# Patient Record
Sex: Male | Born: 1935 | Race: Black or African American | Hispanic: No | Marital: Married | Smoking: Former smoker
Health system: Southern US, Community
[De-identification: ages and names within clinical notes are randomized; demographics above are authoritative.]

## PROBLEM LIST (undated history)

## (undated) DIAGNOSIS — J449 Chronic obstructive pulmonary disease, unspecified: Secondary | ICD-10-CM

## (undated) DIAGNOSIS — I10 Essential (primary) hypertension: Secondary | ICD-10-CM

## (undated) DIAGNOSIS — C61 Malignant neoplasm of prostate: Secondary | ICD-10-CM

## (undated) HISTORY — PX: HERNIA REPAIR: SHX51

## (undated) HISTORY — PX: OTHER SURGICAL HISTORY: SHX169

---

## 1998-04-14 ENCOUNTER — Encounter: Payer: Self-pay | Admitting: Internal Medicine

## 1998-04-14 ENCOUNTER — Emergency Department (HOSPITAL_COMMUNITY): Admission: EM | Admit: 1998-04-14 | Discharge: 1998-04-14 | Payer: Self-pay

## 2001-04-16 ENCOUNTER — Emergency Department (HOSPITAL_COMMUNITY): Admission: EM | Admit: 2001-04-16 | Discharge: 2001-04-16 | Payer: Self-pay | Admitting: Emergency Medicine

## 2003-06-05 ENCOUNTER — Encounter: Admission: RE | Admit: 2003-06-05 | Discharge: 2003-06-05 | Payer: Self-pay | Admitting: General Surgery

## 2003-06-08 ENCOUNTER — Ambulatory Visit (HOSPITAL_COMMUNITY): Admission: RE | Admit: 2003-06-08 | Discharge: 2003-06-08 | Payer: Self-pay | Admitting: General Surgery

## 2003-06-08 ENCOUNTER — Ambulatory Visit (HOSPITAL_BASED_OUTPATIENT_CLINIC_OR_DEPARTMENT_OTHER): Admission: RE | Admit: 2003-06-08 | Discharge: 2003-06-08 | Payer: Self-pay | Admitting: General Surgery

## 2004-08-03 ENCOUNTER — Ambulatory Visit (HOSPITAL_COMMUNITY): Admission: RE | Admit: 2004-08-03 | Discharge: 2004-08-03 | Payer: Self-pay | Admitting: Gastroenterology

## 2004-08-03 ENCOUNTER — Encounter (INDEPENDENT_AMBULATORY_CARE_PROVIDER_SITE_OTHER): Payer: Self-pay | Admitting: Specialist

## 2004-10-26 ENCOUNTER — Ambulatory Visit (HOSPITAL_COMMUNITY): Admission: RE | Admit: 2004-10-26 | Discharge: 2004-10-26 | Payer: Self-pay | Admitting: Urology

## 2004-10-29 ENCOUNTER — Ambulatory Visit (HOSPITAL_COMMUNITY): Admission: RE | Admit: 2004-10-29 | Discharge: 2004-10-29 | Payer: Self-pay | Admitting: Urology

## 2004-11-01 ENCOUNTER — Ambulatory Visit (HOSPITAL_COMMUNITY): Admission: RE | Admit: 2004-11-01 | Discharge: 2004-11-01 | Payer: Self-pay | Admitting: Urology

## 2004-12-02 ENCOUNTER — Encounter (HOSPITAL_COMMUNITY): Admission: RE | Admit: 2004-12-02 | Discharge: 2004-12-09 | Payer: Self-pay | Admitting: Urology

## 2004-12-28 ENCOUNTER — Ambulatory Visit: Admission: RE | Admit: 2004-12-28 | Discharge: 2005-01-06 | Payer: Self-pay | Admitting: Radiation Oncology

## 2005-03-14 ENCOUNTER — Ambulatory Visit: Admission: RE | Admit: 2005-03-14 | Discharge: 2005-03-30 | Payer: Self-pay | Admitting: Radiation Oncology

## 2005-04-06 ENCOUNTER — Ambulatory Visit: Admission: RE | Admit: 2005-04-06 | Discharge: 2005-07-05 | Payer: Self-pay | Admitting: Radiation Oncology

## 2005-05-30 ENCOUNTER — Encounter: Admission: RE | Admit: 2005-05-30 | Discharge: 2005-05-30 | Payer: Self-pay | Admitting: Urology

## 2005-06-05 ENCOUNTER — Ambulatory Visit (HOSPITAL_COMMUNITY): Admission: RE | Admit: 2005-06-05 | Discharge: 2005-06-05 | Payer: Self-pay | Admitting: Urology

## 2005-06-05 ENCOUNTER — Ambulatory Visit (HOSPITAL_BASED_OUTPATIENT_CLINIC_OR_DEPARTMENT_OTHER): Admission: RE | Admit: 2005-06-05 | Discharge: 2005-06-05 | Payer: Self-pay | Admitting: Urology

## 2005-07-13 ENCOUNTER — Ambulatory Visit: Admission: RE | Admit: 2005-07-13 | Discharge: 2005-09-13 | Payer: Self-pay | Admitting: Radiation Oncology

## 2007-01-09 ENCOUNTER — Ambulatory Visit (HOSPITAL_COMMUNITY): Admission: RE | Admit: 2007-01-09 | Discharge: 2007-01-09 | Payer: Self-pay | Admitting: Urology

## 2007-04-18 ENCOUNTER — Ambulatory Visit (HOSPITAL_COMMUNITY): Admission: RE | Admit: 2007-04-18 | Discharge: 2007-04-18 | Payer: Self-pay | Admitting: Urology

## 2007-07-12 ENCOUNTER — Ambulatory Visit (HOSPITAL_COMMUNITY): Admission: RE | Admit: 2007-07-12 | Discharge: 2007-07-12 | Payer: Self-pay | Admitting: Urology

## 2010-07-24 ENCOUNTER — Encounter: Payer: Self-pay | Admitting: Urology

## 2010-11-18 NOTE — Op Note (Signed)
NAME:  Larry Stanley, KRAMP NO.:  [id number]   MEDICAL RECORD NO.:  [id number]          PATIENT TYPE:  AMB   LOCATION:  ENDO                         FACILITY:  [facility]   PHYSICIAN:  Danise Edge, M.D.   DATE OF BIRTH:  1934-03-21   DATE OF PROCEDURE:  08/03/2004  DATE OF DISCHARGE:                                 OPERATIVE REPORT   PROCEDURES:  1.  Esophagogastroduodenoscopy and Savary esophageal dilation.  2.  Colonoscopy.   PROCEDURE INDICATION:  Larry Stanley is a 77 year old male born  03-18-1934. In 1988, Larry Stanley underwent surgery to treat an acute  peptic ulcer disease perforation. He is now experiencing solid food  dysphagia without heartburn for odynophagia.   Larry Stanley is due for his first screening colonoscopy and polypectomy to  prevent colon cancer.   CURRENT MEDICATIONS:  Lisinopril, Nexium, temazepam.   PAST MEDICAL HISTORY:  1.  Hypertension.  2.  Low back pain.  3.  Surgery for peptic ulcer perforation.  4.  Hyperlipidemia.  5.  Insomnia.  6.  Herniorrhaphy surgery.   FAMILY HISTORY:  Negative for colon cancer.   HABITS:  [name] smokes 20 cigarettes per day, but does not consume  alcohol.   ENDOSCOPIST:  Danise Edge, M.D.   PREMEDICATION:  Versed 7 mg, Demerol 50 mg.   PROCEDURE:  Esophagogastroduodenoscopy with Savary esophageal dilation.   DESCRIPTION OF PROCEDURE:  After obtaining informed consent, Larry Stanley  was placed in the left lateral decubitus position. I administered  intravenous Demerol and intravenous Versed to achieve conscious sedation for  the procedure. The patient's blood pressure, oxygen saturation and cardiac  rhythm were monitored throughout the procedure and documented in the medical  record. Larry Stanley ran a sinus tachycardia between 120 and 138 beats per  minute associated with a normal blood pressure and normal oxygen saturation.  I cannot really explain why he has a resting  sinus tachycardia even prior to  receiving conscious sedation for the procedure.   The Olympus gastroscope was passed through the posterior hypopharynx into  the proximal esophagus without difficulty. The hypopharynx, larynx and vocal  cords appeared normal.   ESOPHAGOSCOPY:  The proximal, mid and lower segments of the esophageal  mucosa appear normal. There does appear to be a shallow benign-appearing  peptic stricture at the esophagogastric junction. The Z-line is somewhat  indistinct.   GASTROSCOPY:  Retroflexed view of the gastric cardia and fundus was normal.  The gastric body, antrum and pylorus appear normal.   DUODENOSCOPY:  The duodenal bulb and descending duodenum appear normal.   SAVARY ESOPHAGEAL DILATION:  The Savary dilator wire was passed through the  gastroscope and the tip of the guidewire advanced to the distal gastric  antrum, as confirmed endoscopically. Fluoroscopy was not utilized. The 15-mm  Savary dilator passed without resistance.    Repeat esophagogastroscopy did not show any mucosal dilation of the shallow  peptic stricture at the esophagogastric junction and no gastric trauma due  to the guidewire   Biopsies were taken from the distal esophagus to rule out Barrett's  esophagus. I  was unable to clearly identify a Z-line.   ASSESSMENT:  Benign-appearing peptic stricture at the esophagogastric  junction. Fifteen-millimeter Savary dilator passed without mucosal dilation  of the stricture. Biopsies were taken from the distal esophagus to rule out  Barrett's esophagus.   RECOMMENDATIONS:  Continue proton pump inhibitor therapy. I suspect Mr.  Stanley's dysphagia is due to a motility disorder of the esophagus.   PROCEDURE:  Colonoscopy.   DESCRIPTION OF PROCEDURE:  Anal inspection and digital rectal exam were  unremarkable. Exam of the prostate does reveal a rock-hard enlarged  prostate. The Olympus adjustable pediatric colonoscope was introduced into   the rectum and advanced to the cecum. Colonic preparation for the exam today  was satisfactory.   Larry Stanley has universal colonic diverticulosis.   Rectum normal.   Sigmoid colon and descending colon: At 30 cm from the anal verge, two 1-mm  sessile polyps were removed with cold biopsy forceps.   Splenic flexure normal.   Transverse colon normal.   Hepatic flexure normal.   Ascending colon: In the proximal ascending colon, a 1-mm sessile polyp was  removed with cold biopsy forceps.   Cecum and ileocecal valve were normal.   ASSESSMENT:  1.  A small polyp was removed from the proximal ascending colon and 2 small      polyps were removed from the distal sigmoid colon. All polyps were      submitted in 1 bottle for pathological evaluation.  2.  Universal colonic diverticulosis.  3.  Rock-hard enlarged prostate.   RECOMMENDATIONS:  I am drawing a PSA in the [employer] along  with a CBC.  I am drawing the CBC mainly to assess for anemia that could  cause his resting sinus tachycardia.      MJ/MEDQ  D:  08/03/2004  T:  08/03/2004  Job:  [id number]   cc:   Fleet Contras, M.D.  [address]  [place]  [place] [address]  Fax: [phone]

## 2010-11-18 NOTE — Op Note (Signed)
NAME:  Larry Stanley, Larry Stanley             ACCOUNT NO.:  [id number]   MEDICAL RECORD NO.:  [id number]          PATIENT TYPE:  AMB   LOCATION:  [facility]                         [facility]ACILITY:  [facility]   PHYSICIAN:  Lindaann Slough, M.D.  DATE OF BIRTH:  1934/04/04   DATE OF PROCEDURE:  06/05/2005  DATE OF DISCHARGE:                                 OPERATIVE REPORT   PREOPERATIVE DIAGNOSES:  Adenocarcinoma of prostate.   POSTOPERATIVE DIAGNOSES:  Adenocarcinoma of prostate.   PROCEDURE:  I-125 seed implantation, cystoscopy.   SURGEON:  Danae Chen, M.D. and Maryln Gottron, M.D.   ANESTHESIA:  General.   INDICATIONS FOR PROCEDURE:  The patient is a 77 year old male who has biopsy  proven carcinoma of the prostate, Gleason score 7. PSA at the time of  diagnosis was 41.24. Metastatic workup showed no evidence of metastatic  disease. Treatment options were discussed with the patient and his wife and  they chose to have radiation. The plan is to treat with combination  radiation.  He had IMRT. He is now scheduled for seeds implantation.   Under general anesthesia, the patient was prepped and draped and placed in  the dorsal lithotomy position. Dosimetry planning was done by Dr. Dayton Scrape.  Then under ultrasound guidance, a total of 49 seeds through 18 needles were  implanted in the prostate. The total dose is 19.94 millicuries.   The Foley catheter that was previously placed in the bladder was then  removed. A flexible cystoscope was then inserted in the bladder. The  anterior urethra is normal. There is moderate prostatic hypertrophy. The  bladder is moderately trabeculated. There is no stone, no tumor, no seed in  the bladder. The cystoscope was retroflexed and there was no evidence of  seeds at the bladder neck. The ureteral orifices are in normal position and  shape with clear efflux. The cystoscope was then removed. A #16 Foley  catheter was then reinserted in the bladder.   The patient tolerated  the procedure well and left the OR in satisfactory  condition to post anesthesia care unit.      Lindaann Slough, M.D.  Electronically Signed     MN/MEDQ  D:  06/05/2005  T:  06/05/2005  Job:  [id number]   cc:   Maryln Gottron, M.D.  Fax: [phone]

## 2010-11-18 NOTE — Op Note (Signed)
NAME:  GREGG, WINCHELL NO.:  [id number]   MEDICAL RECORD NO.:  [id number]                   PATIENT TYPE:  AMB   LOCATION:  DSC                                  FACILITY:  [facility]   PHYSICIAN:  Jimmye Norman III, M.D.               DATE OF BIRTH:  September 30, 1933   DATE OF PROCEDURE:  06/08/2003  DATE OF DISCHARGE:                                 OPERATIVE REPORT   PREOPERATIVE DIAGNOSIS:  Right inguinal hernia.   POSTOPERATIVE DIAGNOSIS:  Direct right inguinal hernia.   PROCEDURE:  Right inguinal hernia repair with double layer mesh.   SURGEON:  Jimmye Norman, M.D.   ASSISTANT:  None.   ANESTHESIA:  General endotracheal.   ESTIMATED BLOOD LOSS:  Less than 10 mL.   COMPLICATIONS:  None.   CONDITION:  Stable.   FINDINGS:  The patient had a large medially-placed direct hernia with no  indirect component.   INDICATIONS FOR PROCEDURE:  The patient is a 77 year old with a large right  inguinal hernia who comes in now for a repair.   DESCRIPTION OF PROCEDURE:  The patient was taken to the operating room and  placed on the table in the supine position.  After an adequate endotracheal  anesthetic was administered, he was prepped and draped in the usual sterile  manner, exposing his right groin.  A transverse curvilinear incision 5.0 cm  long was made using a #10 blade at the level of the superficial inguinal  ring.  It was taken down to and through Scarpa's fascia, down to the  external oblique fascia which was then split along its fibers through the  superficial ring.  Directly underneath the opening in the fascia was the  ilioinguinal nerve which coursed down along with the spermatic cord.  We  mobilized the nerve so that it was behind the inferolateral flap of the  opened up external oblique fascia, and we mobilized the spermatic cord.  We  mobilized the cord with the Penrose drain and subsequently placed it up on a  work bench while we were subsequently  able to dissect out the hernia sac  from the anterior and lateral aspect of the cord.  It turns out that this  hernia was a direct hernia with a large direct defect up at the internal  ring.  We imbricated the hernia sac upon itself, closing it off from the  inguinal canal.  There was no indirect component.  Once we had done so, we  irrigated with antibiotic solution and then placed an oval piece of mesh  measuring probably 4.0 cm x 2.0 cm in size, attaching it to the pubic  tubercle and up to the superficial ring.  We tailored it so that it came out  at the internal ring.  Because of the size of the mesh which was attached,  we were able to fold it over and do a double  layer closure of the folded  mesh, attaching the second wing of it to the conjoined tendon, as the  initial wing was also.  Inferolaterally it was attached to the reflected  portion of the inguinal ligament on the apex of the fold of the mesh.  We  irrigated with antibiotic solution and then closed.  We took care not to  entrap the ilioinguinal nerve, which fell back into the inguinal canal with  the spermatic cord.  We closed the fascia using a running #3-0 Vicryl  suture.  We then closed Scarpa's fascia using three interrupted simple  stitches of #3-0 Vicryl.  Then the skin was closed using a running  subcuticular stitch of #4-0 Prolene.  We injected 0.25% Marcaine into the  wound and also at the anterior superior iliac spine, fanning out in three  areas in order to provide local control.  We used 20 mL of 0.5% Marcaine  without epinephrine.  Once this was done, and the skin was closed, we  applied a sterile dressing without problem.                                               Kathrin Ruddy, M.D.    JW/MEDQ  D:  06/08/2003  T:  06/09/2003  Job:  [id number]

## 2011-11-10 ENCOUNTER — Other Ambulatory Visit (HOSPITAL_COMMUNITY): Payer: Self-pay | Admitting: Urology

## 2011-11-10 DIAGNOSIS — Z8042 Family history of malignant neoplasm of prostate: Secondary | ICD-10-CM

## 2011-11-15 ENCOUNTER — Encounter (HOSPITAL_COMMUNITY)
Admission: RE | Admit: 2011-11-15 | Discharge: 2011-11-15 | Disposition: A | Discharge status: Home / Self Care | Payer: Medicare Other | Source: Ambulatory Visit | Attending: Urology | Admitting: Urology

## 2011-11-15 ENCOUNTER — Encounter (HOSPITAL_COMMUNITY): Payer: Self-pay

## 2011-11-15 ENCOUNTER — Ambulatory Visit (HOSPITAL_COMMUNITY)
Admission: RE | Admit: 2011-11-15 | Discharge: 2011-11-15 | Disposition: A | Discharge status: Home / Self Care | Payer: Medicare Other | Source: Ambulatory Visit | Attending: Urology | Admitting: Urology

## 2011-11-15 DIAGNOSIS — Z8042 Family history of malignant neoplasm of prostate: Secondary | ICD-10-CM | POA: Insufficient documentation

## 2011-11-15 HISTORY — DX: Malignant neoplasm of prostate: C61

## 2011-11-15 MED ORDER — TECHNETIUM TC 99M MEDRONATE IV KIT
24.5000 | PACK | Freq: Once | INTRAVENOUS | Status: AC | PRN
  Administered 2011-11-15: 24.5 via INTRAVENOUS

## 2012-09-11 ENCOUNTER — Ambulatory Visit (HOSPITAL_COMMUNITY)
Admission: RE | Admit: 2012-09-11 | Discharge: 2012-09-11 | Disposition: A | Discharge status: Home / Self Care | Payer: Medicare Other | Source: Ambulatory Visit | Attending: Internal Medicine | Admitting: Internal Medicine

## 2012-09-11 ENCOUNTER — Other Ambulatory Visit (HOSPITAL_COMMUNITY): Payer: Self-pay | Admitting: Internal Medicine

## 2012-09-11 DIAGNOSIS — J4489 Other specified chronic obstructive pulmonary disease: Secondary | ICD-10-CM | POA: Insufficient documentation

## 2012-09-11 DIAGNOSIS — J449 Chronic obstructive pulmonary disease, unspecified: Secondary | ICD-10-CM

## 2013-02-03 ENCOUNTER — Emergency Department (HOSPITAL_COMMUNITY)
Admission: EM | Admit: 2013-02-03 | Discharge: 2013-02-03 | Disposition: A | Discharge status: Home / Self Care | Payer: Medicare Other | Attending: Emergency Medicine | Admitting: Emergency Medicine

## 2013-02-03 ENCOUNTER — Encounter (HOSPITAL_COMMUNITY): Payer: Self-pay

## 2013-02-03 ENCOUNTER — Telehealth (HOSPITAL_COMMUNITY): Payer: Self-pay | Admitting: Emergency Medicine

## 2013-02-03 ENCOUNTER — Emergency Department (HOSPITAL_COMMUNITY): Payer: Medicare Other

## 2013-02-03 DIAGNOSIS — R05 Cough: Secondary | ICD-10-CM | POA: Insufficient documentation

## 2013-02-03 DIAGNOSIS — Z79899 Other long term (current) drug therapy: Secondary | ICD-10-CM | POA: Insufficient documentation

## 2013-02-03 DIAGNOSIS — IMO0002 Reserved for concepts with insufficient information to code with codable children: Secondary | ICD-10-CM | POA: Insufficient documentation

## 2013-02-03 DIAGNOSIS — J449 Chronic obstructive pulmonary disease, unspecified: Secondary | ICD-10-CM

## 2013-02-03 DIAGNOSIS — J441 Chronic obstructive pulmonary disease with (acute) exacerbation: Secondary | ICD-10-CM | POA: Insufficient documentation

## 2013-02-03 DIAGNOSIS — Z8546 Personal history of malignant neoplasm of prostate: Secondary | ICD-10-CM | POA: Insufficient documentation

## 2013-02-03 DIAGNOSIS — Z87891 Personal history of nicotine dependence: Secondary | ICD-10-CM | POA: Insufficient documentation

## 2013-02-03 DIAGNOSIS — R059 Cough, unspecified: Secondary | ICD-10-CM | POA: Insufficient documentation

## 2013-02-03 HISTORY — DX: Chronic obstructive pulmonary disease, unspecified: J44.9

## 2013-02-03 LAB — CBC
HCT: 35.3 % — ABNORMAL LOW (ref 39.0–52.0)
Hemoglobin: 11.9 g/dL — ABNORMAL LOW (ref 13.0–17.0)
MCH: 30.7 pg (ref 26.0–34.0)
MCV: 91 fL (ref 78.0–100.0)
Platelets: 261 10*3/uL (ref 150–400)
RDW: 12.5 % (ref 11.5–15.5)
WBC: 11.3 10*3/uL — ABNORMAL HIGH (ref 4.0–10.5)

## 2013-02-03 LAB — BASIC METABOLIC PANEL
CO2: 31 mEq/L (ref 19–32)
Chloride: 101 mEq/L (ref 96–112)
Potassium: 4.1 mEq/L (ref 3.5–5.1)

## 2013-02-03 MED ORDER — METHYLPREDNISOLONE SODIUM SUCC 125 MG IJ SOLR
125.0000 mg | Freq: Once | INTRAMUSCULAR | Status: AC
  Administered 2013-02-03: 125 mg via INTRAVENOUS
  Filled 2013-02-03: qty 2

## 2013-02-03 MED ORDER — ALBUTEROL SULFATE (5 MG/ML) 0.5% IN NEBU
5.0000 mg | INHALATION_SOLUTION | RESPIRATORY_TRACT | Status: DC | PRN
  Administered 2013-02-03: 5 mg via RESPIRATORY_TRACT
  Filled 2013-02-03: qty 0.5

## 2013-02-03 MED ORDER — PREDNISONE 50 MG PO TABS: 50.0000 mg | ORAL_TABLET | Freq: Every day | ORAL | Status: DC

## 2013-02-03 MED ORDER — ALBUTEROL (5 MG/ML) CONTINUOUS INHALATION SOLN
10.0000 mg/h | INHALATION_SOLUTION | RESPIRATORY_TRACT | Status: DC
  Administered 2013-02-03: 10 mg/h via RESPIRATORY_TRACT
  Filled 2013-02-03: qty 20

## 2013-02-03 MED ORDER — IPRATROPIUM BROMIDE 0.02 % IN SOLN
0.5000 mg | Freq: Once | RESPIRATORY_TRACT | Status: AC
  Administered 2013-02-03: 0.5 mg via RESPIRATORY_TRACT
  Filled 2013-02-03: qty 2.5

## 2013-02-03 NOTE — ED Notes (Signed)
ZOX:[id number]<AV> Expected date:<BR> Expected time:<BR> Means of arrival:<BR> Comments:<BR>

## 2013-02-03 NOTE — ED Provider Notes (Signed)
CSN: [id number]     Arrival date & time 02/03/13  0020 History     First MD Initiated Contact with Patient 02/03/13 0033     Chief Complaint  Patient presents with  . Shortness of Breath    HPI Comments: Pt has history of COPD with recurrent attacks.  This episode started this evening.  The treatments by EMS have helped.  Patient is a 79 y.o. male presenting with shortness of breath. The history is provided by the patient.  Shortness of Breath Severity:  Moderate Onset quality:  Gradual Duration:  3 hours Timing:  Constant Progression:  Worsening Chronicity:  Recurrent Context comment:  Pt quit smoking 1.5 year ago Relieved by:  Oxygen and inhaler Worsened by:  Activity Associated symptoms: cough   Associated symptoms: no chest pain, no fever, no sore throat, no vomiting and no wheezing   Risk factors: family hx of DVT   Risk factors: no hx of PE/DVT     Past Medical History  Diagnosis Date  . Prostate cancer   . COPD (chronic obstructive pulmonary disease)    Past Surgical History  Procedure Laterality Date  . Hernia repair    . Gastric ulcer repair     History reviewed. No pertinent family history. History  Substance Use Topics  . Smoking status: Former Games developer  . Smokeless tobacco: Not on file  . Alcohol Use: No    Review of Systems  Constitutional: Negative for fever.  HENT: Negative for sore throat.   Respiratory: Positive for cough and shortness of breath. Negative for wheezing.   Cardiovascular: Negative for chest pain.  Gastrointestinal: Negative for vomiting.  All other systems reviewed and are negative.    Allergies  Demerol and Promethazine  Home Medications   Current Outpatient Rx  Name  Route  Sig  Dispense  Refill  . albuterol (PROVENTIL) (2.5 MG/3ML) 0.083% nebulizer solution   Nebulization   Take 2.5 mg by nebulization every 6 (six) hours as needed for wheezing (SOB).         . Calcium Carbonate-Vitamin D (CALCIUM + D PO)   Oral  Take 1 tablet by mouth daily. Calcium 600 +D   800UI         . lisinopril-hydrochlorothiazide (PRINZIDE,ZESTORETIC) 20-12.5 MG per tablet   Oral   Take 1 tablet by mouth daily.         . tamsulosin (FLOMAX) 0.4 MG CAPS   Oral   Take 0.8 mg by mouth.         . predniSONE (DELTASONE) 50 MG tablet   Oral   Take 1 tablet (50 mg total) by mouth daily.   5 tablet   0     Dispense as written.    BP 144/75  Pulse 108  Temp(Src) 97.8 F (36.6 C) (Oral)  Resp 20  SpO2 96% Physical Exam  Nursing note and vitals reviewed. Constitutional: He appears well-developed and well-nourished. No distress.  HENT:  Head: Normocephalic and atraumatic.  Right Ear: External ear normal.  Left Ear: External ear normal.  Eyes: Conjunctivae are normal. Right eye exhibits no discharge. Left eye exhibits no discharge. No scleral icterus.  Neck: Neck supple. No tracheal deviation present.  Cardiovascular: Normal rate, regular rhythm and intact distal pulses.   Pulmonary/Chest: Effort normal. No accessory muscle usage or stridor. Not tachypneic. No respiratory distress. He has no decreased breath sounds. He has wheezes. He has no rales.  Abdominal: Soft. Bowel sounds are normal. He exhibits no  distension. There is no tenderness. There is no rebound and no guarding.  Musculoskeletal: He exhibits no edema and no tenderness.  Neurological: He is alert. He has normal strength. No sensory deficit. Cranial nerve deficit:  no gross defecits noted. He exhibits normal muscle tone. He displays no seizure activity. Coordination normal.  Skin: Skin is warm and dry. No rash noted.  Psychiatric: He has a normal mood and affect.    ED Course   Medications  albuterol (PROVENTIL) (5 MG/ML) 0.5% nebulizer solution 5 mg (5 mg Nebulization Given 02/03/13 0156)  albuterol (PROVENTIL,VENTOLIN) solution continuous neb (10 mg/hr Nebulization New Bag/Given 02/03/13 0212)  ipratropium (ATROVENT) nebulizer solution 0.5 mg (0.5  mg Nebulization Given 02/03/13 0156)  methylPREDNISolone sodium succinate (SOLU-MEDROL) 125 mg/2 mL injection 125 mg (125 mg Intravenous Given 02/03/13 0135)    Procedures (including critical care time)  Labs Reviewed  BASIC METABOLIC PANEL - Abnormal; Notable for the following:    Glucose, Bld 145 (*)    GFR calc non Af Amer 55 (*)    GFR calc Af Amer 64 (*)    All other components within normal limits  CBC - Abnormal; Notable for the following:    WBC 11.3 (*)    RBC 3.88 (*)    Hemoglobin 11.9 (*)    HCT 35.3 (*)    All other components within normal limits   Dg Chest 2 View  02/03/2013   *RADIOLOGY REPORT*  Clinical Data: New onset shortness of breath.  CHEST - 2 VIEW  Comparison: PA and lateral chest 09/11/2012 and 05/30/2005.  Findings: The lungs are clear.  Heart size is normal.  No pneumothorax or pleural effusion.  Surgical clips right upper quadrant of the abdomen noted.  IMPRESSION: No acute disease.   Original Report Authenticated By: Holley Dexter, M.D.   1. COPD (chronic obstructive pulmonary disease)     MDM  The patient improved with serial albuterol treatments as well as IV Solu-Medrol.  On repeat exam patient had very faint occasional wheeze. He was not having any retractions. His oxygen saturation was in the low to mid 90s range off oxygen.  The patient felt that his back at his baseline and felt comfortable going home at this time.  He'll be discharged home on a course of oral steroids. We'll continue his breathing treatments at home. I encouraged him to followup with his Dr. this week to be rechecked  Celene Kras, MD 02/03/13 [phone]

## 2013-02-03 NOTE — Discharge Instructions (Signed)

## 2013-02-03 NOTE — ED Notes (Signed)
Per EMS, pt has hx of COPD.  Has inhaler/nebulizer  at home.  Increased evening shortness of breath x 2 days.  Unable to catch breath tonight and called 911.  Tx in route.  Wheezing noted. Pt on nebulizer on EMS arrival and sats 99%.  Vitals:  bp 164/100, hr 110, resp 22, sats 99% post tx.  Hx Prostate ca,

## 2013-02-28 ENCOUNTER — Institutional Professional Consult (permissible substitution): Payer: Medicare Other | Admitting: Emergency Medicine

## 2013-08-28 ENCOUNTER — Inpatient Hospital Stay (HOSPITAL_COMMUNITY)
Admission: EM | Admit: 2013-08-28 | Discharge: 2013-08-30 | DRG: 191 | Disposition: A | Discharge status: Home / Self Care | Payer: Medicare HMO | Attending: Internal Medicine | Admitting: Internal Medicine

## 2013-08-28 ENCOUNTER — Encounter (HOSPITAL_COMMUNITY): Payer: Self-pay | Admitting: Emergency Medicine

## 2013-08-28 DIAGNOSIS — R339 Retention of urine, unspecified: Secondary | ICD-10-CM

## 2013-08-28 DIAGNOSIS — R140 Abdominal distension (gaseous): Secondary | ICD-10-CM

## 2013-08-28 DIAGNOSIS — J449 Chronic obstructive pulmonary disease, unspecified: Secondary | ICD-10-CM

## 2013-08-28 DIAGNOSIS — R143 Flatulence: Secondary | ICD-10-CM

## 2013-08-28 DIAGNOSIS — R300 Dysuria: Secondary | ICD-10-CM

## 2013-08-28 DIAGNOSIS — R197 Diarrhea, unspecified: Secondary | ICD-10-CM | POA: Diagnosis present

## 2013-08-28 DIAGNOSIS — N390 Urinary tract infection, site not specified: Secondary | ICD-10-CM

## 2013-08-28 DIAGNOSIS — Z87891 Personal history of nicotine dependence: Secondary | ICD-10-CM

## 2013-08-28 DIAGNOSIS — R0902 Hypoxemia: Secondary | ICD-10-CM

## 2013-08-28 DIAGNOSIS — E860 Dehydration: Secondary | ICD-10-CM

## 2013-08-28 DIAGNOSIS — Z79899 Other long term (current) drug therapy: Secondary | ICD-10-CM

## 2013-08-28 DIAGNOSIS — J441 Chronic obstructive pulmonary disease with (acute) exacerbation: Principal | ICD-10-CM | POA: Diagnosis present

## 2013-08-28 DIAGNOSIS — R142 Eructation: Secondary | ICD-10-CM | POA: Diagnosis present

## 2013-08-28 DIAGNOSIS — N179 Acute kidney failure, unspecified: Secondary | ICD-10-CM | POA: Diagnosis present

## 2013-08-28 DIAGNOSIS — Z885 Allergy status to narcotic agent status: Secondary | ICD-10-CM

## 2013-08-28 DIAGNOSIS — N35919 Unspecified urethral stricture, male, unspecified site: Secondary | ICD-10-CM

## 2013-08-28 DIAGNOSIS — R141 Gas pain: Secondary | ICD-10-CM | POA: Diagnosis present

## 2013-08-28 DIAGNOSIS — Z888 Allergy status to other drugs, medicaments and biological substances status: Secondary | ICD-10-CM

## 2013-08-28 DIAGNOSIS — C61 Malignant neoplasm of prostate: Secondary | ICD-10-CM | POA: Diagnosis present

## 2013-08-28 DIAGNOSIS — R000 Tachycardia, unspecified: Secondary | ICD-10-CM

## 2013-08-28 NOTE — ED Notes (Addendum)
Pt from home via [facility] c/o dysuria and difficulty urinating x 1 day. He had an episode of urinary incontinence en route. Hx of of prostate CA in which he had surgery. C/o lower abdominal pain that has been relieved with his incontinent episode. Pt's urologist is  Dr. Larwance Sachs. Pt also reports shortness of breath. Hx of COPD

## 2013-08-28 NOTE — ED Notes (Signed)
Bed: WA06 Expected date: 08/28/13 Expected time: 11:20 PM Means of arrival: Ambulance Comments: Painful urination

## 2013-08-28 NOTE — ED Notes (Signed)
Pt from home via [facility] c/o dysuria and urinary retention x 1 day.

## 2013-08-29 ENCOUNTER — Emergency Department (HOSPITAL_COMMUNITY): Payer: Medicare HMO

## 2013-08-29 ENCOUNTER — Inpatient Hospital Stay (HOSPITAL_COMMUNITY): Payer: Medicare HMO

## 2013-08-29 ENCOUNTER — Encounter (HOSPITAL_COMMUNITY): Payer: Self-pay

## 2013-08-29 DIAGNOSIS — R140 Abdominal distension (gaseous): Secondary | ICD-10-CM | POA: Diagnosis present

## 2013-08-29 DIAGNOSIS — J441 Chronic obstructive pulmonary disease with (acute) exacerbation: Secondary | ICD-10-CM | POA: Insufficient documentation

## 2013-08-29 DIAGNOSIS — N179 Acute kidney failure, unspecified: Secondary | ICD-10-CM | POA: Diagnosis present

## 2013-08-29 DIAGNOSIS — R142 Eructation: Secondary | ICD-10-CM

## 2013-08-29 DIAGNOSIS — R143 Flatulence: Secondary | ICD-10-CM

## 2013-08-29 DIAGNOSIS — N390 Urinary tract infection, site not specified: Secondary | ICD-10-CM | POA: Diagnosis present

## 2013-08-29 DIAGNOSIS — J449 Chronic obstructive pulmonary disease, unspecified: Secondary | ICD-10-CM | POA: Insufficient documentation

## 2013-08-29 DIAGNOSIS — C61 Malignant neoplasm of prostate: Secondary | ICD-10-CM | POA: Diagnosis present

## 2013-08-29 DIAGNOSIS — E860 Dehydration: Secondary | ICD-10-CM | POA: Diagnosis present

## 2013-08-29 DIAGNOSIS — I498 Other specified cardiac arrhythmias: Secondary | ICD-10-CM

## 2013-08-29 DIAGNOSIS — R141 Gas pain: Secondary | ICD-10-CM

## 2013-08-29 DIAGNOSIS — R339 Retention of urine, unspecified: Secondary | ICD-10-CM | POA: Diagnosis present

## 2013-08-29 DIAGNOSIS — R197 Diarrhea, unspecified: Secondary | ICD-10-CM | POA: Diagnosis present

## 2013-08-29 LAB — BASIC METABOLIC PANEL
BUN: 19 mg/dL (ref 6–23)
CO2: 28 mEq/L (ref 19–32)
Calcium: 9.8 mg/dL (ref 8.4–10.5)
Chloride: 91 mEq/L — ABNORMAL LOW (ref 96–112)
Creatinine, Ser: 1.63 mg/dL — ABNORMAL HIGH (ref 0.50–1.35)
GFR, EST AFRICAN AMERICAN: 45 mL/min — AB (ref >90)
GFR, EST NON AFRICAN AMERICAN: 39 mL/min — AB (ref >90)
Glucose, Bld: 191 mg/dL — ABNORMAL HIGH (ref 70–99)
POTASSIUM: 4.9 meq/L (ref 3.7–5.3)
SODIUM: 134 meq/L — AB (ref 137–147)

## 2013-08-29 LAB — HEPATIC FUNCTION PANEL
ALT: 16 U/L (ref 0–53)
AST: 19 U/L (ref 0–37)
Albumin: 3.8 g/dL (ref 3.5–5.2)
Alkaline Phosphatase: 56 U/L (ref 39–117)
Bilirubin, Direct: <0.2 mg/dL (ref 0.0–0.3)
TOTAL PROTEIN: 6.9 g/dL (ref 6.0–8.3)
Total Bilirubin: 0.4 mg/dL (ref 0.3–1.2)

## 2013-08-29 LAB — CBC
HCT: 39.6 % (ref 39.0–52.0)
Hemoglobin: 13.6 g/dL (ref 13.0–17.0)
MCH: 29.8 pg (ref 26.0–34.0)
MCHC: 34.3 g/dL (ref 30.0–36.0)
MCV: 86.7 fL (ref 78.0–100.0)
Platelets: 232 10*3/uL (ref 150–400)
RBC: 4.57 MIL/uL (ref 4.22–5.81)
RDW: 12.6 % (ref 11.5–15.5)
WBC: 19.7 10*3/uL — AB (ref 4.0–10.5)

## 2013-08-29 LAB — PRO B NATRIURETIC PEPTIDE: Pro B Natriuretic peptide (BNP): 18.5 pg/mL (ref 0–450)

## 2013-08-29 LAB — I-STAT TROPONIN, ED: TROPONIN I, POC: 0 ng/mL (ref 0.00–0.08)

## 2013-08-29 LAB — URINALYSIS, ROUTINE W REFLEX MICROSCOPIC
BILIRUBIN URINE: NEGATIVE
Glucose, UA: NEGATIVE mg/dL
Ketones, ur: NEGATIVE mg/dL
NITRITE: POSITIVE — AB
PROTEIN: NEGATIVE mg/dL
Specific Gravity, Urine: 1.015 (ref 1.005–1.030)
UROBILINOGEN UA: 1 mg/dL (ref 0.0–1.0)
pH: 5.5 (ref 5.0–8.0)

## 2013-08-29 LAB — URINE MICROSCOPIC-ADD ON

## 2013-08-29 LAB — CLOSTRIDIUM DIFFICILE BY PCR: Toxigenic C. Difficile by PCR: NEGATIVE

## 2013-08-29 LAB — I-STAT CG4 LACTIC ACID, ED: Lactic Acid, Venous: 0.91 mmol/L (ref 0.5–2.2)

## 2013-08-29 MED ORDER — METHYLPREDNISOLONE SODIUM SUCC 125 MG IJ SOLR
125.0000 mg | Freq: Once | INTRAMUSCULAR | Status: AC
  Administered 2013-08-29: 125 mg via INTRAVENOUS
  Filled 2013-08-29: qty 2

## 2013-08-29 MED ORDER — SODIUM CHLORIDE 0.9 % IV BOLUS (SEPSIS)
1000.0000 mL | Freq: Once | INTRAVENOUS | Status: AC
  Administered 2013-08-29: 1000 mL via INTRAVENOUS

## 2013-08-29 MED ORDER — LEVALBUTEROL HCL 1.25 MG/0.5ML IN NEBU
1.2500 mg | INHALATION_SOLUTION | Freq: Once | RESPIRATORY_TRACT | Status: AC
  Administered 2013-08-29: 1.25 mg via RESPIRATORY_TRACT
  Filled 2013-08-29: qty 3

## 2013-08-29 MED ORDER — IOHEXOL 350 MG/ML SOLN
75.0000 mL | Freq: Once | INTRAVENOUS | Status: AC | PRN
  Administered 2013-08-29: 75 mL via INTRAVENOUS

## 2013-08-29 MED ORDER — SODIUM CHLORIDE 0.9 % IV SOLN: 250.0000 mL | INTRAVENOUS | Status: DC | PRN

## 2013-08-29 MED ORDER — LEVALBUTEROL HCL 0.63 MG/3ML IN NEBU
0.6300 mg | INHALATION_SOLUTION | Freq: Four times a day (QID) | RESPIRATORY_TRACT | Status: DC | PRN
Start: 2013-08-29 — End: 2013-08-30

## 2013-08-29 MED ORDER — SODIUM CHLORIDE 0.9 % IJ SOLN: 3.0000 mL | Freq: Two times a day (BID) | INTRAMUSCULAR | Status: DC

## 2013-08-29 MED ORDER — SODIUM CHLORIDE 0.9 % IV BOLUS (SEPSIS): 1000.0000 mL | Freq: Once | INTRAVENOUS | Status: DC

## 2013-08-29 MED ORDER — LEVALBUTEROL HCL 0.63 MG/3ML IN NEBU
0.6300 mg | INHALATION_SOLUTION | Freq: Four times a day (QID) | RESPIRATORY_TRACT | Status: DC
  Administered 2013-08-29 – 2013-08-30 (×4): 0.63 mg via RESPIRATORY_TRACT
  Filled 2013-08-29 (×10): qty 3

## 2013-08-29 MED ORDER — SODIUM CHLORIDE 0.9 % IJ SOLN: 3.0000 mL | INTRAMUSCULAR | Status: DC | PRN

## 2013-08-29 MED ORDER — DEXTROSE 5 % IV SOLN
1.0000 g | Freq: Once | INTRAVENOUS | Status: AC
  Administered 2013-08-29: 1 g via INTRAVENOUS
  Filled 2013-08-29: qty 10

## 2013-08-29 MED ORDER — PREDNISONE 20 MG PO TABS
20.0000 mg | ORAL_TABLET | Freq: Two times a day (BID) | ORAL | Status: DC
  Administered 2013-08-29 – 2013-08-30 (×3): 20 mg via ORAL
  Filled 2013-08-29 (×5): qty 1

## 2013-08-29 MED ORDER — ONDANSETRON HCL 4 MG/2ML IJ SOLN: 4.0000 mg | Freq: Four times a day (QID) | INTRAMUSCULAR | Status: DC | PRN

## 2013-08-29 MED ORDER — IPRATROPIUM BROMIDE 0.02 % IN SOLN
0.5000 mg | Freq: Four times a day (QID) | RESPIRATORY_TRACT | Status: DC
  Administered 2013-08-29 – 2013-08-30 (×4): 0.5 mg via RESPIRATORY_TRACT
  Filled 2013-08-29 (×6): qty 2.5

## 2013-08-29 MED ORDER — LIDOCAINE HCL 2 % EX GEL
CUTANEOUS | Status: AC
  Administered 2013-08-29: 1
  Filled 2013-08-29: qty 10

## 2013-08-29 MED ORDER — DEXTROSE 5 % IV SOLN
1.0000 g | INTRAVENOUS | Status: DC
  Administered 2013-08-30: 1 g via INTRAVENOUS
  Filled 2013-08-29: qty 10

## 2013-08-29 MED ORDER — ONDANSETRON HCL 4 MG PO TABS: 4.0000 mg | ORAL_TABLET | Freq: Four times a day (QID) | ORAL | Status: DC | PRN

## 2013-08-29 MED ORDER — TAMSULOSIN HCL 0.4 MG PO CAPS
0.8000 mg | ORAL_CAPSULE | Freq: Every day | ORAL | Status: DC
Start: 2013-08-29 — End: 2013-08-30
  Administered 2013-08-29 – 2013-08-30 (×2): 0.8 mg via ORAL
  Filled 2013-08-29 (×2): qty 2

## 2013-08-29 NOTE — Progress Notes (Signed)
PROGRESS NOTE   Deadrian Toya GMW:[id number] DOB: 1934/01/26 DOA: 08/28/2013 PCP: No primary provider on file.  Brief narrative: Larry Stanley is an 80 y.o. male with a PMH of COPD and prostate cancer status post seed implantation who was admitted 08/29/13 with a 2 day history of diarrhea, wheezing/shortness of breath, urinary retention and dysuria.  Assessment/Plan: Principal Problem:   COPD with acute exacerbation Admitted and given 125 mg of Solumedrol followed by Prednisone 20 mg BID, nebulized bronchodilator therapy, and empiric Rocephin.  CT of the chest negative for PE and underlying pneumonia. Patient reports his breathing is much better today. Active Problems:   Prostate cancer Status post seed implantation.  Sees Dr. Janice Norrie.   Urinary retention / abdominal distention Nursing staff Unable to place Foley catheter. Has a history of stricture.  Nursing staff report that he has been urinating.  Resume Flomax. Renal ultrasound negative for hydronephrosis.   UTI (lower urinary tract infection) On empiric Rocephin. Followup urine cultures.   Dehydration / acute renal failure Continue IV fluids.   Diarrhea  Followup C. difficile PCR studies.   DVT Prophylaxis SCDs.  Code Status: Full. Family Communication: No family at the bedside. Disposition Plan: Home when stable.   IV access:  Peripheral IV.  Medical Consultants:  None.  Other Consultants:  None.  Anti-infectives:  Rocephin 08/29/13--->  HPI/Subjective: Larry Stanley feels much better today. He states his breathing has improved and that he has been able to empty his bladder. No further diarrhea.  Objective: Filed Vitals:   08/29/13 0426 08/29/13 0500 08/29/13 0540 08/29/13 0635  BP:  151/89 145/77 141/81  Pulse: 116 116 118 120  Temp:    97.7 F (36.5 C)  TempSrc:    Oral  Resp: 24 21 34 28  Height:    5\' 11"  (1.803 m)  Weight:    63.776 kg (140 lb 9.6 oz)  SpO2: 97% 98% 98% 95%     Intake/Output Summary (Last 24 hours) at 08/29/13 4403 Last data filed at 08/29/13 0357  Gross per 24 hour  Intake    125 ml  Output    450 ml  Net   -325 ml    Exam: Gen:  NAD Cardiovascular:  RRR, No M/R/G Respiratory:  Lungs with a few high pitched wheezes Gastrointestinal:  Abdomen soft, NT/ND, + BS Extremities:  No C/E/C  Data Reviewed: Basic Metabolic Panel:  Recent Labs Lab 08/29/13  NA 134*  K 4.9  CL 91*  CO2 28  GLUCOSE 191*  BUN 19  CREATININE 1.63*  CALCIUM 9.8   GFR Estimated Creatinine Clearance: 34.2 ml/min (by C-G formula based on Cr of 1.63). Liver Function Tests:  Recent Labs Lab 08/29/13 0534  AST 19  ALT 16  ALKPHOS 56  BILITOT 0.4  PROT 6.9  ALBUMIN 3.8   CBC:  Recent Labs Lab 08/29/13  WBC 19.7*  HGB 13.6  HCT 39.6  MCV 86.7  PLT 232   BNP (last 3 results)  Recent Labs  08/29/13 0001  PROBNP 18.5   Microbiology No results found for this or any previous visit (from the past 240 hour(s)).   Procedures and Diagnostic Studies: Ct Angio Chest Pe W/cm &/or Wo Cm  08/29/2013   CLINICAL DATA:  Shortness of breath and leukocytosis.  EXAM: CT ANGIOGRAPHY CHEST WITH CONTRAST  TECHNIQUE: Multidetector CT imaging of the chest was performed using the standard protocol during bolus administration of intravenous contrast. Multiplanar CT image reconstructions and MIPs were  obtained to evaluate the vascular anatomy.  CONTRAST:  75 mL of Omnipaque 350 IV contrast  COMPARISON:  Chest radiograph performed earlier today at 12:47 a.m.  FINDINGS: There is no evidence of pulmonary embolus.  Minimal emphysematous change is noted, predominantly in the upper lung lobes. The lungs are otherwise clear. There is no evidence of significant focal consolidation, pleural effusion or pneumothorax. No masses are identified; no abnormal focal contrast enhancement is seen.  The mediastinum is unremarkable in appearance, aside from calcification along the aortic  arch and scattered coronary artery calcifications. A tiny hiatal hernia is seen. No pericardial effusion is identified. No mediastinal lymphadenopathy is seen the great vessels are grossly unremarkable in appearance. No axillary lymphadenopathy is seen. The visualized portions of the thyroid gland are unremarkable in appearance.  A 1.0 cm hypodensity within the left hepatic lobe is nonspecific but may reflect a small cyst. The visualized portions of the spleen and adrenal glands are grossly unremarkable.  No acute osseous abnormalities are seen.  Review of the MIP images confirms the above findings.  IMPRESSION: 1. No evidence of pulmonary embolus. 2. Minimal emphysematous change noted, predominantly at the upper lung lobes. Lungs otherwise clear. 3. Tiny hiatal hernia seen. 4. Nonspecific 1.0 cm hypodensity within the left hepatic lobe may reflect a small cyst.   Electronically Signed   By: Garald Balding M.D.   On: 08/29/2013 03:50   Dg Chest Port 1 View  08/29/2013   CLINICAL DATA:  Shortness of breath  EXAM: PORTABLE CHEST - 1 VIEW  COMPARISON:  02/03/2013 and prior chest radiographs  FINDINGS: The cardiomediastinal silhouette is unremarkable.  COPD changes noted.  Mild pulmonary vascular congestion is noted.  There is no evidence of focal airspace disease, pulmonary edema, suspicious pulmonary nodule/mass, pleural effusion, or pneumothorax. No acute bony abnormalities are identified.  IMPRESSION: Mild pulmonary vascular congestion.  COPD.   Electronically Signed   By: Hassan Rowan M.D.   On: 08/29/2013 00:54    Scheduled Meds: . [START ON 08/30/2013] cefTRIAXone (ROCEPHIN)  IV  1 g Intravenous Q24H  . ipratropium  0.5 mg Nebulization Q6H  . levalbuterol  0.63 mg Nebulization Q6H  . predniSONE  20 mg Oral BID WC  . sodium chloride  3 mL Intravenous Q12H  . sodium chloride  3 mL Intravenous Q12H   Continuous Infusions:   Time spent: 30 minutes.   LOS: 1 day   RAMA,CHRISTINA  [facility] Pager [phone]. If unable to reach me by pager, please call my cell phone at [phone].  *Please note that the hospitalists switch teams on Wednesdays. Please call the flow manager at [phone] if you are having difficulty reaching the hospitalist taking care of this patient as she can update you and provide the most up-to-date pager number of provider caring for the patient. If 8PM-8AM, please contact night-coverage at [email or web address], password [facility]  08/29/2013, 8:12 AM    **Disclaimer: This note was dictated with voice recognition software. Similar sounding words can inadvertently be transcribed and this note may contain transcription errors which may not have been corrected upon publication of note.**

## 2013-08-29 NOTE — ED Provider Notes (Signed)
CSN: [id number]     Arrival date & time 08/28/13  2337 History   First MD Initiated Contact with Patient 08/29/13 0001     Chief Complaint  Patient presents with  . Urinary Retention  . Dysuria  . Shortness of Breath     (Consider location/radiation/quality/duration/timing/severity/associated sxs/prior Treatment) HPI 80 year old male presents to emergency room from home via EMS with complaint of urinary retention, lower abdominal distention, and pain.  Patient noted to be wheezing, tachypneic, and hypoxic upon presentation as well.  Patient is a poor historian.  Per his wife, patient has not urinated throughout the day, she noticed that he was acting different this evening, and did not want dinner.  They called his urologist, when it was discovered he had not urinated through the day, and they recommended a warm bath.  When this did not relieve symptoms, they called 911.  Patient reports he had urinary incontinence in route, which relieved his symptoms.  Patient reports history of COPD, normally receives albuterol nebulized treatments.  He does not normally wear oxygen.  He denies any fever or cough.  He is not currently on steroids to no leg swelling, no recent illnesses.  He denies history of tachycardia.  No prior history of urinary retention.  He does have a remote history of prostate cancer treated by Dr. Janice Norrie  Past Medical History  Diagnosis Date  . Prostate cancer   . COPD (chronic obstructive pulmonary disease)    Past Surgical History  Procedure Laterality Date  . Hernia repair    . Gastric ulcer repair     History reviewed. No pertinent family history. History  Substance Use Topics  . Smoking status: Former Research scientist (life sciences)  . Smokeless tobacco: Not on file  . Alcohol Use: No    Review of Systems  See History of Present Illness; otherwise all other systems are reviewed and negative   Allergies  Demerol and Promethazine  Home Medications   Current Outpatient Rx  Name  Route   Sig  Dispense  Refill  . albuterol (PROVENTIL HFA;VENTOLIN HFA) 108 (90 BASE) MCG/ACT inhaler   Inhalation   Inhale into the lungs every 6 (six) hours as needed for wheezing or shortness of breath.         Marland Kitchen albuterol (PROVENTIL) (2.5 MG/3ML) 0.083% nebulizer solution   Nebulization   Take 2.5 mg by nebulization every 6 (six) hours as needed for wheezing (SOB).         . Calcium Carbonate-Vitamin D (CALCIUM + D PO)   Oral   Take 1 tablet by mouth daily. Calcium 600 +D   800UI         . esomeprazole (NEXIUM) 40 MG capsule   Oral   Take 40 mg by mouth daily at 12 noon.         Marland Kitchen lisinopril-hydrochlorothiazide (PRINZIDE,ZESTORETIC) 20-12.5 MG per tablet   Oral   Take 1 tablet by mouth daily.         . tamsulosin (FLOMAX) 0.4 MG CAPS   Oral   Take 0.8 mg by mouth.          BP 141/81  Temp(Src) 98.3 F (36.8 C) (Oral)  Resp 36  Ht 5\' 11"  (1.803 m)  Wt 144 lb (65.318 kg)  BMI 20.09 kg/m2  SpO2 100% Physical Exam  Nursing note and vitals reviewed. Constitutional: He is oriented to person, place, and time. He appears well-developed and well-nourished. He appears distressed.  HENT:  Head: Normocephalic and atraumatic.  Nose: Nose normal.  Mouth/Throat: Oropharynx is clear and moist.  Cardiovascular: Regular rhythm, normal heart sounds and intact distal pulses.  Exam reveals no gallop and no friction rub.   No murmur heard. Tachycardia  Pulmonary/Chest: He is in respiratory distress.  Patient with tachypnea, and end expiratory wheezing  Abdominal: He exhibits distension (patient's abdomen appears distended, he reports this is his norm.). He exhibits no mass. There is no tenderness. There is no rebound.  Decreased bowel sounds  Genitourinary: No penile tenderness.  Patient is uncircumcised.  Foreskin is easily reduced.  Patient appears to have a stricture at the distal end of the urethra.  Unable to pass Foley catheter, straight catheter, despite use of viscous  lidocaine.  With this.  His lidocaine, passes easily.  Bladder scan done, 200 cc of urine in the bladder, patient does not report any lower abdominal pressure or pain, since having urination with EMS  Musculoskeletal: He exhibits no edema and no tenderness.  Neurological: He is alert and oriented to person, place, and time. He exhibits normal muscle tone. Coordination normal.  Skin: Skin is warm and dry. No rash noted. No erythema. No pallor.    ED Course  Procedures (including critical care time) Labs Review Labs Reviewed  BASIC METABOLIC PANEL - Abnormal; Notable for the following:    Sodium 134 (*)    Chloride 91 (*)    Glucose, Bld 191 (*)    Creatinine, Ser 1.63 (*)    GFR calc non Af Amer 39 (*)    GFR calc Af Amer 45 (*)    All other components within normal limits  CBC - Abnormal; Notable for the following:    WBC 19.7 (*)    All other components within normal limits  URINALYSIS, ROUTINE W REFLEX MICROSCOPIC - Abnormal; Notable for the following:    Color, Urine ORANGE (*)    APPearance CLOUDY (*)    Hgb urine dipstick LARGE (*)    Nitrite POSITIVE (*)    Leukocytes, UA SMALL (*)    All other components within normal limits  URINE CULTURE  URINE MICROSCOPIC-ADD ON  PRO B NATRIURETIC PEPTIDE  I-STAT TROPOININ, ED   Imaging Review Ct Angio Chest Pe W/cm &/or Wo Cm  08/29/2013   CLINICAL DATA:  Shortness of breath and leukocytosis.  EXAM: CT ANGIOGRAPHY CHEST WITH CONTRAST  TECHNIQUE: Multidetector CT imaging of the chest was performed using the standard protocol during bolus administration of intravenous contrast. Multiplanar CT image reconstructions and MIPs were obtained to evaluate the vascular anatomy.  CONTRAST:  75 mL of Omnipaque 350 IV contrast  COMPARISON:  Chest radiograph performed earlier today at 12:47 a.m.  FINDINGS: There is no evidence of pulmonary embolus.  Minimal emphysematous change is noted, predominantly in the upper lung lobes. The lungs are otherwise  clear. There is no evidence of significant focal consolidation, pleural effusion or pneumothorax. No masses are identified; no abnormal focal contrast enhancement is seen.  The mediastinum is unremarkable in appearance, aside from calcification along the aortic arch and scattered coronary artery calcifications. A tiny hiatal hernia is seen. No pericardial effusion is identified. No mediastinal lymphadenopathy is seen the great vessels are grossly unremarkable in appearance. No axillary lymphadenopathy is seen. The visualized portions of the thyroid gland are unremarkable in appearance.  A 1.0 cm hypodensity within the left hepatic lobe is nonspecific but may reflect a small cyst. The visualized portions of the spleen and adrenal glands are grossly unremarkable.  No acute  osseous abnormalities are seen.  Review of the MIP images confirms the above findings.  IMPRESSION: 1. No evidence of pulmonary embolus. 2. Minimal emphysematous change noted, predominantly at the upper lung lobes. Lungs otherwise clear. 3. Tiny hiatal hernia seen. 4. Nonspecific 1.0 cm hypodensity within the left hepatic lobe may reflect a small cyst.   Electronically Signed   By: Garald Balding M.D.   On: 08/29/2013 03:50   Dg Chest Port 1 View  08/29/2013   CLINICAL DATA:  Shortness of breath  EXAM: PORTABLE CHEST - 1 VIEW  COMPARISON:  02/03/2013 and prior chest radiographs  FINDINGS: The cardiomediastinal silhouette is unremarkable.  COPD changes noted.  Mild pulmonary vascular congestion is noted.  There is no evidence of focal airspace disease, pulmonary edema, suspicious pulmonary nodule/mass, pleural effusion, or pneumothorax. No acute bony abnormalities are identified.  IMPRESSION: Mild pulmonary vascular congestion.  COPD.   Electronically Signed   By: Hassan Rowan M.D.   On: 08/29/2013 00:54    EKG Interpretation    Date/Time:  Friday August 29 2013 01:23:05 EST Ventricular Rate:  135 PR Interval:  148 QRS Duration: 75 QT  Interval:  277 QTC Calculation: 415 R Axis:   77 Text Interpretation:  Sinus tachycardia Since last tracing rate faster Confirmed by Tifini Reeder  MD, Yuki Brunsman ([id number]) on 08/29/2013 3:51:50 AM            MDM   Final diagnoses:  COPD exacerbation  Urethral stricture  Dysuria  Hypoxia  Sinus tachycardia    80 year old male with tachycardia, tachypnea, new oxygen requirement.  Lab showed leukocytosis, and urine with too numerous to count red blood cells, some wbc, nitrite.  He should appears to have a urethral stricture, no prior history of same.  Chest x-ray without pneumonia, but mild pulmonary vascular congestion with COPD.  Wheezing has resolved with Xopenex, the patient is persistently tachycardic in the 130s sinus rhythm.  Will check BNP, plan for CT angio chest for possible PE as source of symptoms.  Expect patient will need admission given his persistent tachycardia.  May need to discuss also with urology as patient has been unable to pass urine, as well as he normally does, and appears to have a urethral stricture.  4:30 AM Pt's HR has improved with fluids slightly, but he still is hypoxic and returns to HR in 130s with ambulation.  No PE noted on CTA.  Will need admit to hospital for probable COPD exacerbation.  Pt has been able to urinate some here, but may still need inpatient consult to urology for urethral dilation.  Kalman Drape, MD 08/29/13 [phone]

## 2013-08-29 NOTE — Care Management Note (Signed)
    Page 1 of 1   08/29/2013     2:19:37 PM   CARE MANAGEMENT NOTE 08/29/2013  Patient:  Larry Stanley, Larry Stanley   Account Number:  [id number]  Date Initiated:  08/29/2013  Documentation initiated by:  Dessa Phi  Subjective/Objective Assessment:   80 Y/O M ADMITTED W/INABILITY TO URINATE.COPD EXAC.TK:[id number] CA.     Action/Plan:   FROM HOME.HAS PCP,PHARMACY.   Anticipated DC Date:  09/01/2013   Anticipated DC Plan:  [place]  CM consult      Choice offered to / List presented to:             Status of service:  In process, will continue to follow Medicare Important Message given?   (If response is "NO", the following Medicare IM given date fields will be blank) Date Medicare IM given:   Date Additional Medicare IM given:    Discharge Disposition:    Per UR Regulation:  Reviewed for med. necessity/level of care/duration of stay  If discussed at Long Length of Stay Meetings, dates discussed:    Comments:  08/29/13 Idan Prime RN,BSN NCM [phone] NO ANTICIPATED D/C NEEDS.

## 2013-08-29 NOTE — ED Notes (Signed)
Pt and wife would like all medical history or information be only discussed with them and no other family members.

## 2013-08-29 NOTE — ED Notes (Signed)
Pt transported to CT ?

## 2013-08-29 NOTE — ED Notes (Signed)
Pt ambulated in hall with assistance of cane. Pt oxygen saturation on room air was 93% with a heart rate of 118. At the end of ambulation patient oxygen saturation was 88% on room air with a heart rate of 141.

## 2013-08-29 NOTE — ED Notes (Signed)
Repeat EKG given to MD [facility].

## 2013-08-29 NOTE — ED Notes (Signed)
Pt returned from CT °

## 2013-08-29 NOTE — ED Notes (Signed)
Hospitalist at bedside 

## 2013-08-29 NOTE — ED Notes (Signed)
Bed: WA22 Expected date:  Expected time:  Means of arrival:  Comments: 

## 2013-08-29 NOTE — ED Notes (Signed)
Unsuccessful attempts at foley catheter. 69 F and 57F used. Straight Catheter also used. Pt has a stricture and catheter will not enter urinary meatus. MD Sharol Given Aware.

## 2013-08-29 NOTE — H&P (Signed)
Chief Complaint:  Cant pee  HPI: 80 yo male who has h/o prostate cancer 8 years ago s/p seed implantation, copd comes in with over 24 hours of not being able to urinate and dysuria when any little dribble comes out.  Denies fevers.  They called his urologist who advised for him to take a hot bath, but that did not help.  He did not drink anything all day long as he was scared b/c he was not urinating.  No n/v.  Has also been having diarrhea for 2 days nonbloody.  Also with worsening abd distention and sob.  Pt says he is bloated b/c of gas.  No n/v.  No abd pain.  Was wheezing initially on arrival to ED, which has resolved with xoponex.  Pt is still sob, which is new for him.  He feels some better since arrival to ED.  Review of Systems:  Positive and negative as per HPI otherwise all other systems are negative  Past Medical History: Past Medical History  Diagnosis Date  . Prostate cancer   . COPD (chronic obstructive pulmonary disease)    Past Surgical History  Procedure Laterality Date  . Hernia repair    . Gastric ulcer repair      Medications: Prior to Admission medications   Medication Sig Start Date End Date Taking? Authorizing Provider  albuterol (PROVENTIL HFA;VENTOLIN HFA) 108 (90 BASE) MCG/ACT inhaler Inhale into the lungs every 6 (six) hours as needed for wheezing or shortness of breath.   Yes Historical Provider, MD  albuterol (PROVENTIL) (2.5 MG/3ML) 0.083% nebulizer solution Take 2.5 mg by nebulization every 6 (six) hours as needed for wheezing (SOB).   Yes Historical Provider, MD  Calcium Carbonate-Vitamin D (CALCIUM + D PO) Take 1 tablet by mouth daily. Calcium 600 +D   800UI   Yes Historical Provider, MD  esomeprazole (NEXIUM) 40 MG capsule Take 40 mg by mouth daily at 12 noon.   Yes Historical Provider, MD  lisinopril-hydrochlorothiazide (PRINZIDE,ZESTORETIC) 20-12.5 MG per tablet Take 1 tablet by mouth daily.   Yes Historical Provider, MD  tamsulosin (FLOMAX) 0.4  MG CAPS Take 0.8 mg by mouth.   Yes Historical Provider, MD    Allergies:   Allergies  Allergen Reactions  . Demerol [Meperidine]   . Promethazine Swelling    Social History:  reports that he has quit smoking. He does not have any smokeless tobacco history on file. He reports that he does not drink alcohol or use illicit drugs.  Family History: History reviewed. No pertinent family history.  Physical Exam: Filed Vitals:   08/29/13 0330 08/29/13 0400 08/29/13 0426 08/29/13 0500  BP: 127/84 133/81  151/89  Pulse: 113 113 116 116  Temp:      TempSrc:      Resp: 22 30 24 21   Height:      Weight:      SpO2: 97% 97% 97% 98%   General appearance: alert, cooperative and mild distress Head: Normocephalic, without obvious abnormality, atraumatic Eyes: negative Nose: Nares normal. Septum midline. Mucosa normal. No drainage or sinus tenderness. Neck: no JVD and supple, symmetrical, trachea midline Lungs: clear to auscultation bilaterally Heart: regular rate and rhythm, S1, S2 normal, no murmur, click, rub or gallop Abdomen: normal findings: bowel sounds normal and soft, non-tender and abnormal findings:  distended no r/g nonacute Extremities: extremities normal, atraumatic, no cyanosis or edema Pulses: 2+ and symmetric Skin: Skin color, texture, turgor normal. No rashes or lesions Neurologic: Grossly normal  Labs on Admission:   Recent Labs  08/29/13  NA 134*  K 4.9  CL 91*  CO2 28  GLUCOSE 191*  BUN 19  CREATININE 1.63*  CALCIUM 9.8    Recent Labs  08/29/13  WBC 19.7*  HGB 13.6  HCT 39.6  MCV 86.7  PLT 232   Radiological Exams on Admission: Ct Angio Chest Pe W/cm &/or Wo Cm  08/29/2013   CLINICAL DATA:  Shortness of breath and leukocytosis.  EXAM: CT ANGIOGRAPHY CHEST WITH CONTRAST  TECHNIQUE: Multidetector CT imaging of the chest was performed using the standard protocol during bolus administration of intravenous contrast. Multiplanar CT image  reconstructions and MIPs were obtained to evaluate the vascular anatomy.  CONTRAST:  75 mL of Omnipaque 350 IV contrast  COMPARISON:  Chest radiograph performed earlier today at 12:47 a.m.  FINDINGS: There is no evidence of pulmonary embolus.  Minimal emphysematous change is noted, predominantly in the upper lung lobes. The lungs are otherwise clear. There is no evidence of significant focal consolidation, pleural effusion or pneumothorax. No masses are identified; no abnormal focal contrast enhancement is seen.  The mediastinum is unremarkable in appearance, aside from calcification along the aortic arch and scattered coronary artery calcifications. A tiny hiatal hernia is seen. No pericardial effusion is identified. No mediastinal lymphadenopathy is seen the great vessels are grossly unremarkable in appearance. No axillary lymphadenopathy is seen. The visualized portions of the thyroid gland are unremarkable in appearance.  A 1.0 cm hypodensity within the left hepatic lobe is nonspecific but may reflect a small cyst. The visualized portions of the spleen and adrenal glands are grossly unremarkable.  No acute osseous abnormalities are seen.  Review of the MIP images confirms the above findings.  IMPRESSION: 1. No evidence of pulmonary embolus. 2. Minimal emphysematous change noted, predominantly at the upper lung lobes. Lungs otherwise clear. 3. Tiny hiatal hernia seen. 4. Nonspecific 1.0 cm hypodensity within the left hepatic lobe may reflect a small cyst.   Electronically Signed   By: Garald Balding M.D.   On: 08/29/2013 03:50   Dg Chest Port 1 View  08/29/2013   CLINICAL DATA:  Shortness of breath  EXAM: PORTABLE CHEST - 1 VIEW  COMPARISON:  02/03/2013 and prior chest radiographs  FINDINGS: The cardiomediastinal silhouette is unremarkable.  COPD changes noted.  Mild pulmonary vascular congestion is noted.  There is no evidence of focal airspace disease, pulmonary edema, suspicious pulmonary nodule/mass,  pleural effusion, or pneumothorax. No acute bony abnormalities are identified.  IMPRESSION: Mild pulmonary vascular congestion.  COPD.   Electronically Signed   By: Hassan Rowan M.D.   On: 08/29/2013 00:54    Assessment/Plan  80 yo male with urinary retention from probable uretral stricture, uti, mild renal failure, dehydration, abd distention along with mild copde  Principal Problem:   COPD with acute exacerbation  Place on some prednisone, freq nebs.  Think his sob may be more related to his abd distention.  See below.  Active Problems:   COPD (chronic obstructive pulmonary disease)   Prostate cancer   Urinary retention   UTI (lower urinary tract infection)  Rocephin iv   Dehydration     Acute renal failure     Abdominal distention-  Will obtain lfts and abd u/s.  Also ck cdiff and lactic acid level.  rescan bladder.   Diarrhea  Ck cdiff.  Will need to call urology in am, as we could not place foley due to stricture in the ED,  will hold off on any further fluids at this time until foley placed and bladder rescanned.  Karsen Fellows A 08/29/2013, 5:35 AM

## 2013-08-30 DIAGNOSIS — R339 Retention of urine, unspecified: Secondary | ICD-10-CM

## 2013-08-30 DIAGNOSIS — R197 Diarrhea, unspecified: Secondary | ICD-10-CM

## 2013-08-30 DIAGNOSIS — N179 Acute kidney failure, unspecified: Secondary | ICD-10-CM

## 2013-08-30 DIAGNOSIS — N35919 Unspecified urethral stricture, male, unspecified site: Secondary | ICD-10-CM

## 2013-08-30 LAB — URINE CULTURE
CULTURE: NO GROWTH
Colony Count: NO GROWTH

## 2013-08-30 LAB — BASIC METABOLIC PANEL
BUN: 28 mg/dL — ABNORMAL HIGH (ref 6–23)
CHLORIDE: 101 meq/L (ref 96–112)
CO2: 27 meq/L (ref 19–32)
Calcium: 9.3 mg/dL (ref 8.4–10.5)
Creatinine, Ser: 1.47 mg/dL — ABNORMAL HIGH (ref 0.50–1.35)
GFR calc non Af Amer: 44 mL/min — ABNORMAL LOW (ref >90)
GFR, EST AFRICAN AMERICAN: 51 mL/min — AB (ref >90)
Glucose, Bld: 171 mg/dL — ABNORMAL HIGH (ref 70–99)
POTASSIUM: 3.9 meq/L (ref 3.7–5.3)
Sodium: 142 mEq/L (ref 137–147)

## 2013-08-30 LAB — CBC
HEMATOCRIT: 35.5 % — AB (ref 39.0–52.0)
Hemoglobin: 11.7 g/dL — ABNORMAL LOW (ref 13.0–17.0)
MCH: 29.1 pg (ref 26.0–34.0)
MCHC: 33 g/dL (ref 30.0–36.0)
MCV: 88.3 fL (ref 78.0–100.0)
PLATELETS: 253 10*3/uL (ref 150–400)
RBC: 4.02 MIL/uL — AB (ref 4.22–5.81)
RDW: 13.1 % (ref 11.5–15.5)
WBC: 18.1 10*3/uL — AB (ref 4.0–10.5)

## 2013-08-30 MED ORDER — LEVOFLOXACIN 500 MG PO TABS: 500.0000 mg | ORAL_TABLET | Freq: Every day | ORAL | Status: DC

## 2013-08-30 MED ORDER — PREDNISONE 10 MG PO TABS: 5.0000 mg | ORAL_TABLET | Freq: Every day | ORAL | Status: DC

## 2013-08-30 MED ORDER — PREDNISONE 10 MG PO TABS
ORAL_TABLET | ORAL | Status: DC
Start: 2013-08-30 — End: 2016-10-17

## 2013-08-30 NOTE — Discharge Instructions (Signed)
Chronic Obstructive Pulmonary Disease Exacerbation  Chronic obstructive pulmonary disease (COPD) is a common lung problem. In COPD, the flow of air from the lungs is limited. COPD exacerbations are times that breathing gets worse and you need extra treatment. Without treatment they can be life threatening. If they happen often, your lungs can become more damaged. HOME CARE  Do not smoke.  Avoid tobacco smoke and other things that bother your lungs.  If given, take your antibiotic medicine as told. Finish the medicine even if you start to feel better.  Only take medicines as told by your doctor.  Drink enough fluids to keep your pee (urine) clear or pale yellow (unless your doctor has told you not to).  Use a cool mist machine (vaporizer).  If you use oxygen or a machine that turns liquid medicine into a mist (nebulizer), continue to use them as told.  Keep up with shots (vaccinations) as told by your doctor.  Exercise regularly.  Eat healthy foods.  Keep all doctor visits as told. GET HELP RIGHT AWAY IF:  You are very short of breath and it gets worse.  You have trouble talking.  You have bad chest pain.  You have blood in your spit (sputum).  You have a fever.  You keep throwing up (vomiting).  You feel weak, or you pass out (faint).  You feel confused.  You keep getting worse. MAKE SURE YOU:   Understand these instructions.  Will watch your condition.  Will get help right away if you are not doing well or get worse. Document Released: 06/08/2011 Document Revised: 04/09/2013 Document Reviewed: 02/21/2013 [facility] Patient Information 2014 [place].  Acute Urinary Retention, Male Acute urinary retention is the temporary inability to urinate. This is a common problem in older men. As men age their prostates become larger and block the flow of urine from the bladder. This is usually a problem that has come on gradually.  HOME CARE INSTRUCTIONS If you are  sent home with a Foley catheter and a drainage system, you will need to discuss the best course of action with your health care provider. While the catheter is in, maintain a good intake of fluids. Keep the drainage bag emptied and lower than your catheter. This is so that contaminated urine will not flow back into your bladder, which could lead to a urinary tract infection. There are two main types of drainage bags. One is a large bag that usually is used at night. It has a good capacity that will allow you to sleep through the night without having to empty it. The second type is called a leg bag. It has a smaller capacity, so it needs to be emptied more frequently. However, the main advantage is that it can be attached by a leg strap and can go underneath your clothing, allowing you the freedom to move about or leave your home. Only take over-the-counter or prescription medicines for pain, discomfort, or fever as directed by your health care provider.  SEEK MEDICAL CARE IF:  You develop a low-grade fever.  You experience spasms or leakage of urine with the spasms. SEEK IMMEDIATE MEDICAL CARE IF:   You develop chills or fever.  Your catheter stops draining urine.  Your catheter falls out.  You start to develop increased bleeding that does not respond to rest and increased fluid intake. MAKE SURE YOU:  Understand these instructions.  Will watch your condition.  Will get help right away if you are not doing  well or get worse. Document Released: 09/25/2000 Document Revised: 02/19/2013 Document Reviewed: 11/28/2012 [facility] Patient Information 2014 [place], [place].

## 2013-08-30 NOTE — Discharge Summary (Signed)
Physician Discharge Summary  Navon Obie [id number] DOB: [birth year] DOA: 08/28/2013  PCP: Philis Fendt, MD  Admit date: 08/28/2013 Discharge date: 08/30/2013  Recommendations for Outpatient Follow-up:  1. F/U with Dr. Lorra Hals for a BMET in 1 week, resume anti-hypertensive if renal function back to baseline. 2. F/U with Dr. Janice Norrie in 1 week to evaluate urinary retention.  Discharge Diagnoses:  Principal Problem:    COPD with acute exacerbation Active Problems:    Prostate cancer    Urinary retention without evidence of UTI (lower urinary tract infection)    Dehydration    Acute renal failure    Abdominal distention    Diarrhea  Discharge Condition: Improved.  Diet recommendation: Low sodium, heart healthy.  History of present illness:  Larry Stanley is an 80 y.o. male with a PMH of COPD and prostate cancer status post seed implantation who was admitted 08/29/13 with a 2 day history of diarrhea, wheezing/shortness of breath, urinary retention and dysuria.  Hospital Course by problem:  Principal Problem:  COPD with acute exacerbation  Admitted and given 125 mg of Solumedrol followed by Prednisone 20 mg BID, nebulized bronchodilator therapy, and empiric Rocephin. CT of the chest negative for PE and underlying pneumonia. Patient reported significant improvement in his breathing 24 hours after admission.  D/C home on prednisone taper and 5 days of Levaquin.  Active Problems:  Prostate cancer  Status post seed implantation. Sees Dr. Janice Norrie. F/U with him post discharge. Urinary retention / abdominal distention without evidence of UTI  Nursing staff was unable to place Foley catheter. Has a history of stricture. The patient was able to void on his own.  A post void residual was checked prior to D/C, and was 50 cc.  . Continue Flomax. Renal ultrasound negative for hydronephrosis. Treated with empiric Rocephin for possible UTI but urine cultures negative.  No need for further  antibiotics.  Dehydration / acute renal failure  Renal function improved at D/C.  Hold Lisinopril/HCTZ until renal function back to baseline.  Diarrhea  C. difficile PCR studies negative.  Discharge Exam: Filed Vitals:   08/30/13 0600  BP: 132/82  Pulse: 99  Temp: 97.8 F (36.6 C)  Resp: 22   Filed Vitals:   08/29/13 1925 08/29/13 2128 08/30/13 0600 08/30/13 1000  BP:  113/66 132/82   Pulse:  95 99   Temp:  97.8 F (36.6 C) 97.8 F (36.6 C)   TempSrc:  Oral Oral   Resp:  26 22   Height:      Weight:   63.277 kg (139 lb 8 oz)   SpO2: 97% 96% 98% 94%    Gen:  NAD Cardiovascular:  RRR, No M/R/G Respiratory: Lungs CTAB Gastrointestinal: Abdomen soft, NT/ND with normal active bowel sounds. Extremities: No C/E/C   Discharge Instructions      Discharge Orders   Future Orders Complete By Expires   Call MD for:  severe uncontrolled pain  As directed    Call MD for:  temperature >100.4  As directed    Call MD for:  As directed    Scheduling Instructions:     Worsening shortness of breath, increased mucous production, inability to urinate.   Diet - low sodium heart healthy  As directed    Discharge instructions  As directed    Comments:     Stop your blood pressure medicine until you follow up with Dr. Lorra Hals and let him check your kidney function with a blood test.    You  were cared for by Dr. Jacquelynn Cree  (a hospitalist) during your hospital stay. If you have any questions about your discharge medications or the care you received while you were in the hospital after you are discharged, you can call the unit and ask to speak with the hospitalist on call if the hospitalist that took care of you is not available. Once you are discharged, your primary care physician will handle any further medical issues. Please note that NO REFILLS for any discharge medications will be authorized once you are discharged, as it is imperative that you return to your primary care physician (or  establish a relationship with a primary care physician if you do not have one) for your aftercare needs so that they can reassess your need for medications and monitor your lab values.  Any outstanding tests can be reviewed by your PCP at your follow up visit.  It is also important to review any medicine changes with your PCP.  Please bring these d/c instructions with you to your next visit so your physician can review these changes with you.  If you do not have a primary care physician, you can call [phone] for a physician referral.  It is highly recommended that you obtain a PCP for hospital follow up.   Increase activity slowly  As directed        Medication List    STOP taking these medications       lisinopril-hydrochlorothiazide 20-12.5 MG per tablet  Commonly known as:  PRINZIDE,ZESTORETIC      TAKE these medications       albuterol 108 (90 BASE) MCG/ACT inhaler  Commonly known as:  PROVENTIL HFA;VENTOLIN HFA  Inhale into the lungs every 6 (six) hours as needed for wheezing or shortness of breath.     albuterol (2.5 MG/3ML) 0.083% nebulizer solution  Commonly known as:  PROVENTIL  Take 2.5 mg by nebulization every 6 (six) hours as needed for wheezing (SOB).     budesonide-formoterol 160-4.5 MCG/ACT inhaler  Commonly known as:  SYMBICORT  Inhale 2 puffs into the lungs 2 (two) times daily.     CALCIUM + D PO  Take 1 tablet by mouth daily. Calcium 600 +D   800UI     esomeprazole 40 MG capsule  Commonly known as:  NEXIUM  Take 40 mg by mouth daily at 12 noon.     levofloxacin 500 MG tablet  Commonly known as:  LEVAQUIN  Take 1 tablet (500 mg total) by mouth daily.     predniSONE 10 MG tablet  Commonly known as:  DELTASONE  Take 3 tablets daily for 2 days, then 2 tablets daily for 2 days, then 1 tablet daily for 2 days then stop.     tamsulosin 0.4 MG Caps capsule  Commonly known as:  FLOMAX  Take 0.8 mg by mouth.       Follow-up Information   Follow up with  AVBUERE,EDWIN A, MD In 1 week. (For blood work and to determine if it is safe for you to resume your blood pressure medication.)    Specialty:  Internal Medicine   Contact information:   [place] [address] [phone]       Follow up with Nesi, Kirtland Bouchard, MD. Schedule an appointment as soon as possible for a visit in 1 week. (To follow up on your urinary retention.)    Specialty:  Urology   Contact information:   [address] [place] [address]  [address] [place] [address] [phone]        The results of significant diagnostics from this hospitalization (including imaging, microbiology, ancillary and laboratory) are listed below for reference.    Significant Diagnostic Studies: Ct Angio Chest Pe W/cm &/or Wo Cm  08/29/2013   CLINICAL DATA:  Shortness of breath and leukocytosis.  EXAM: CT ANGIOGRAPHY CHEST WITH CONTRAST  TECHNIQUE: Multidetector CT imaging of the chest was performed using the standard protocol during bolus administration of intravenous contrast. Multiplanar CT image reconstructions and MIPs were obtained to evaluate the vascular anatomy.  CONTRAST:  75 mL of Omnipaque 350 IV contrast  COMPARISON:  Chest radiograph performed earlier today at 12:47 a.m.  FINDINGS: There is no evidence of pulmonary embolus.  Minimal emphysematous change is noted, predominantly in the upper lung lobes. The lungs are otherwise clear. There is no evidence of significant focal consolidation, pleural effusion or pneumothorax. No masses are identified; no abnormal focal contrast enhancement is seen.  The mediastinum is unremarkable in appearance, aside from calcification along the aortic arch and scattered coronary artery calcifications. A tiny hiatal hernia is seen. No pericardial effusion is identified. No mediastinal lymphadenopathy is seen the great vessels are grossly unremarkable in appearance. No axillary lymphadenopathy is seen. The visualized  portions of the thyroid gland are unremarkable in appearance.  A 1.0 cm hypodensity within the left hepatic lobe is nonspecific but may reflect a small cyst. The visualized portions of the spleen and adrenal glands are grossly unremarkable.  No acute osseous abnormalities are seen.  Review of the MIP images confirms the above findings.  IMPRESSION: 1. No evidence of pulmonary embolus. 2. Minimal emphysematous change noted, predominantly at the upper lung lobes. Lungs otherwise clear. 3. Tiny hiatal hernia seen. 4. Nonspecific 1.0 cm hypodensity within the left hepatic lobe may reflect a small cyst.   Electronically Signed   By: Roanna Raider M.D.   On: 08/29/2013 03:50   US Renal  08/29/2013   CLINICAL DATA:  Urinary retention.  Assess for hydronephrosis.  EXAM: RENAL/URINARY TRACT ULTRASOUND COMPLETE  COMPARISON:  None.  FINDINGS: Right Kidney:  Length: 10.3 cm. Echogenicity within normal limits. No hydronephrosis visualized. There are simple cysts within the right kidney largest in the upper to mid pole measures 1.1 x 1.3 x 1 cm.  Left Kidney:  Length: 9.9 cm. Echogenicity within normal limits. No hydronephrosis visualized. There are simple cysts in the left kidney, largest in the lower pole measures 1.8 x 1.8 x 1.9 cm.  Bladder:  Appears normal for degree of bladder distention.  IMPRESSION: No hydronephrosis bilaterally.  Simple cysts in both kidneys.   Electronically Signed   By: Sherian Rein M.D.   On: 08/29/2013 09:41   Dg Chest Port 1 View  08/29/2013   CLINICAL DATA:  Shortness of breath  EXAM: PORTABLE CHEST - 1 VIEW  COMPARISON:  02/03/2013 and prior chest radiographs  FINDINGS: The cardiomediastinal silhouette is unremarkable.  COPD changes noted.  Mild pulmonary vascular congestion is noted.  There is no evidence of focal airspace disease, pulmonary edema, suspicious pulmonary nodule/mass, pleural effusion, or pneumothorax. No acute bony abnormalities are identified.  IMPRESSION: Mild pulmonary  vascular congestion.  COPD.   Electronically Signed   By: Laveda Abbe M.D.   On: 08/29/2013 00:54    Labs:  Basic Metabolic Panel:  Recent Labs Lab 08/29/13 08/30/13 0514  NA 134* 142  K 4.9 3.9  CL 91* 101  CO2 28 27  GLUCOSE 191*  171*  BUN 19 28*  CREATININE 1.63* 1.47*  CALCIUM 9.8 9.3   GFR Estimated Creatinine Clearance: 37.7 ml/min (by C-G formula based on Cr of 1.47). Liver Function Tests:  Recent Labs Lab 08/29/13 0534  AST 19  ALT 16  ALKPHOS 56  BILITOT 0.4  PROT 6.9  ALBUMIN 3.8    CBC:  Recent Labs Lab 08/29/13 08/30/13 0514  WBC 19.7* 18.1*  HGB 13.6 11.7*  HCT 39.6 35.5*  MCV 86.7 88.3  PLT 232 253   Microbiology Recent Results (from the past 240 hour(s))  URINE CULTURE     Status: None   Collection Time    08/29/13 12:10 AM      Result Value Ref Range Status   Specimen Description URINE, CLEAN CATCH   Final   Special Requests NONE   Final   Culture  Setup Time     Final   Value: 08/29/2013 11:28     Performed at [facility]     Final   Value: NO GROWTH     Performed at [facility]   Culture     Final   Value: NO GROWTH     Performed at [facility]   Report Status 08/30/2013 FINAL   Final  CLOSTRIDIUM DIFFICILE BY PCR     Status: None   Collection Time    08/29/13  8:52 AM      Result Value Ref Range Status   C difficile by pcr NEGATIVE  NEGATIVE Final   Comment: Performed at [facility]    Time coordinating discharge: 35 minutes.  Signed:  Azharia Surratt  Pager [phone] [facility] 08/30/2013, 11:48 AM

## 2014-05-26 ENCOUNTER — Other Ambulatory Visit (HOSPITAL_COMMUNITY): Payer: Self-pay | Admitting: Urology

## 2014-05-26 DIAGNOSIS — C61 Malignant neoplasm of prostate: Secondary | ICD-10-CM

## 2014-06-04 ENCOUNTER — Encounter (HOSPITAL_COMMUNITY)
Admission: RE | Admit: 2014-06-04 | Discharge: 2014-06-04 | Disposition: A | Discharge status: Home / Self Care | Payer: Medicare HMO | Source: Ambulatory Visit | Attending: Urology | Admitting: Urology

## 2014-06-04 DIAGNOSIS — C61 Malignant neoplasm of prostate: Secondary | ICD-10-CM | POA: Diagnosis present

## 2014-06-04 MED ORDER — TECHNETIUM TC 99M MEDRONATE IV KIT
25.7000 | PACK | Freq: Once | INTRAVENOUS | Status: AC | PRN
  Administered 2014-06-04: 25.7 via INTRAVENOUS

## 2016-08-01 ENCOUNTER — Other Ambulatory Visit: Payer: Self-pay | Admitting: Urology

## 2016-08-01 DIAGNOSIS — Z8546 Personal history of malignant neoplasm of prostate: Secondary | ICD-10-CM

## 2016-08-07 ENCOUNTER — Other Ambulatory Visit: Payer: Self-pay | Admitting: General Surgery

## 2016-08-08 ENCOUNTER — Ambulatory Visit (HOSPITAL_COMMUNITY)
Admission: RE | Admit: 2016-08-08 | Discharge: 2016-08-08 | Disposition: A | Discharge status: Home / Self Care | Payer: Medicare HMO | Source: Ambulatory Visit | Attending: Urology | Admitting: Urology

## 2016-08-08 ENCOUNTER — Encounter (HOSPITAL_COMMUNITY): Payer: Self-pay

## 2016-08-08 DIAGNOSIS — Z87891 Personal history of nicotine dependence: Secondary | ICD-10-CM | POA: Diagnosis not present

## 2016-08-08 DIAGNOSIS — I10 Essential (primary) hypertension: Secondary | ICD-10-CM | POA: Insufficient documentation

## 2016-08-08 DIAGNOSIS — J449 Chronic obstructive pulmonary disease, unspecified: Secondary | ICD-10-CM | POA: Insufficient documentation

## 2016-08-08 DIAGNOSIS — Z8546 Personal history of malignant neoplasm of prostate: Secondary | ICD-10-CM | POA: Insufficient documentation

## 2016-08-08 DIAGNOSIS — R590 Localized enlarged lymph nodes: Secondary | ICD-10-CM | POA: Insufficient documentation

## 2016-08-08 DIAGNOSIS — C774 Secondary and unspecified malignant neoplasm of inguinal and lower limb lymph nodes: Secondary | ICD-10-CM | POA: Insufficient documentation

## 2016-08-08 HISTORY — DX: Essential (primary) hypertension: I10

## 2016-08-08 LAB — CBC
HCT: 39.4 % (ref 39.0–52.0)
Hemoglobin: 13.2 g/dL (ref 13.0–17.0)
MCH: 29.8 pg (ref 26.0–34.0)
MCHC: 33.5 g/dL (ref 30.0–36.0)
MCV: 88.9 fL (ref 78.0–100.0)
PLATELETS: 263 10*3/uL (ref 150–400)
RBC: 4.43 MIL/uL (ref 4.22–5.81)
RDW: 13.6 % (ref 11.5–15.5)
WBC: 9.4 10*3/uL (ref 4.0–10.5)

## 2016-08-08 LAB — PROTIME-INR
INR: 1.07
PROTHROMBIN TIME: 13.9 s (ref 11.4–15.2)

## 2016-08-08 LAB — APTT: aPTT: 39 seconds — ABNORMAL HIGH (ref 24–36)

## 2016-08-08 MED ORDER — FLUMAZENIL 0.5 MG/5ML IV SOLN
INTRAVENOUS | Status: AC
  Filled 2016-08-08: qty 5

## 2016-08-08 MED ORDER — MIDAZOLAM HCL 2 MG/2ML IJ SOLN
INTRAMUSCULAR | Status: AC
  Filled 2016-08-08: qty 4

## 2016-08-08 MED ORDER — SODIUM CHLORIDE 0.9 % IV SOLN
INTRAVENOUS | Status: DC
  Administered 2016-08-08: 12:00:00 via INTRAVENOUS

## 2016-08-08 MED ORDER — FENTANYL CITRATE (PF) 100 MCG/2ML IJ SOLN
INTRAMUSCULAR | Status: AC
  Filled 2016-08-08: qty 4

## 2016-08-08 MED ORDER — MIDAZOLAM HCL 2 MG/2ML IJ SOLN
INTRAMUSCULAR | Status: AC | PRN
  Administered 2016-08-08 (×2): 1 mg via INTRAVENOUS

## 2016-08-08 MED ORDER — NALOXONE HCL 0.4 MG/ML IJ SOLN
INTRAMUSCULAR | Status: AC
  Filled 2016-08-08: qty 1

## 2016-08-08 MED ORDER — FENTANYL CITRATE (PF) 100 MCG/2ML IJ SOLN
INTRAMUSCULAR | Status: AC | PRN
  Administered 2016-08-08 (×2): 50 ug via INTRAVENOUS

## 2016-08-08 NOTE — H&P (Signed)
Chief Complaint: Patient was seen in consultation today for left inguinal lymph node biopsy at the request of Herrick,Larry Stanley  Referring Physician(s): Herrick,Larry Stanley  Supervising Physician: Arne Cleveland  Patient Status: Larry Stanley  History of Present Illness: Larry Stanley is a 83 y.o. male with hx of prostate cancer. He now has pelvic and inguinal adenopathy on imaging concerning for malignant process. He is referred for US guided biopsy PMHx, meds, labs, imaging reviewed. Has been NPO all morning. Wife is at bedside  Past Medical History:  Diagnosis Date  . COPD (chronic obstructive pulmonary disease) ([place])   . Hypertension   . Prostate cancer [facility])     Past Surgical History:  Procedure Laterality Date  . gastric ulcer repair    . HERNIA REPAIR      Allergies: Demerol [meperidine] and Promethazine  Medications: Prior to Admission medications   Medication Sig Start Date End Date Taking? Authorizing Provider  albuterol (PROVENTIL HFA;VENTOLIN HFA) 108 (90 BASE) MCG/ACT inhaler Inhale into the lungs every 6 (six) hours as needed for wheezing or shortness of breath.   Yes Historical Provider, MD  albuterol (PROVENTIL) (2.5 MG/3ML) 0.083% nebulizer solution Take 2.5 mg by nebulization every 6 (six) hours as needed for wheezing (SOB).   Yes Historical Provider, MD  budesonide-formoterol (SYMBICORT) 160-4.5 MCG/ACT inhaler Inhale 2 puffs into the lungs 2 (two) times daily.   Yes Historical Provider, MD  lisinopril (PRINIVIL,ZESTRIL) 10 MG tablet Take 10 mg by mouth daily.   Yes Historical Provider, MD  ranitidine (ZANTAC) 75 MG tablet Take 75 mg by mouth 2 (two) times daily.   Yes Historical Provider, MD  tamsulosin (FLOMAX) 0.4 MG CAPS Take 0.8 mg by mouth.   Yes Historical Provider, MD  Calcium Carbonate-Vitamin D (CALCIUM + D PO) Take 1 tablet by mouth daily. Calcium 600 +D   800UI    Historical Provider, MD  esomeprazole (NEXIUM) 40 MG capsule Take 40 mg  by mouth daily at 12 noon.    Historical Provider, MD  levofloxacin (LEVAQUIN) 500 MG tablet Take 1 tablet (500 mg total) by mouth daily. 08/30/13   Venetia Maxon Rama, MD  predniSONE (DELTASONE) 10 MG tablet Take 3 tablets daily for 2 days, then 2 tablets daily for 2 days, then 1 tablet daily for 2 days then stop. 08/30/13   Venetia Maxon Rama, MD     History reviewed. No pertinent family history.  Social History   Social History  . Marital status: Married    Spouse name: N/A  . Number of children: N/A  . Years of education: N/A   Social History Main Topics  . Smoking status: Former Smoker    Types: Cigarettes    Quit date: 08/09/2011  . Smokeless tobacco: Never Used  . Alcohol use No  . Drug use: No  . Sexual activity: Not Asked   Other Topics Concern  . None   Social History Narrative  . None    Review of Systems: A 12 point ROS discussed and pertinent positives are indicated in the HPI above.  All other systems are negative.  Review of Systems  Vital Signs: BP (!) 142/89   Pulse 100   Temp 97.7 F (36.5 C) (Oral)   Resp 16   SpO2 100%   Physical Exam  Constitutional: He is oriented to person, place, and time. He appears well-developed and well-nourished. No distress.  HENT:  Head: Normocephalic.  Mouth/Throat: Oropharynx is clear and moist.  Neck: Normal  range of motion. No JVD present. No tracheal deviation present.  Cardiovascular: Normal rate, regular rhythm and normal heart sounds.   Pulmonary/Chest: Effort normal and breath sounds normal. No respiratory distress.  Abdominal: Soft. He exhibits no distension. There is no tenderness.  Musculoskeletal:  Firm, NT left inguinal adenopathy palpable  Neurological: He is alert and oriented to person, place, and time.  Skin: Skin is warm and dry.  Psychiatric: He has a normal mood and affect. Judgment normal.    Mallampati Score:  MD Evaluation Airway: WNL Heart: WNL Abdomen: WNL Chest/ Lungs: WNL ASA   Classification: 3 Mallampati/Airway Score: One  Imaging: No results found.  Labs:  CBC: No results for input(s): WBC, HGB, HCT, PLT in the last 8760 hours.  COAGS: No results for input(s): INR, APTT in the last 8760 hours.  BMP: No results for input(s): NA, K, CL, CO2, GLUCOSE, BUN, CALCIUM, CREATININE, GFRNONAA, GFRAA in the last 8760 hours.  Invalid input(s): CMP  LIVER FUNCTION TESTS: No results for input(s): BILITOT, AST, ALT, ALKPHOS, PROT, ALBUMIN in the last 8760 hours.  TUMOR MARKERS: No results for input(s): AFPTM, CEA, CA199, CHROMGRNA in the last 8760 hours.  Assessment and Plan: Hx of prostate cancer Pelvic and inguinal adenopathy Plan for US guided biopsy of left inguinal adenopathy Labs pending Risks and Benefits discussed with the patient including, but not limited to bleeding, infection, damage to adjacent structures or low yield requiring additional tests. All of the patient's questions were answered, patient is agreeable to proceed. Consent signed and in chart.    Thank you for this interesting consult.  I greatly enjoyed meeting Heywood Dabish and look forward to participating in their care.  A copy of this report was sent to the requesting provider on this date.  Electronically Signed: Ascencion Dike 08/08/2016, 11:47 AM   I spent a total of 20 minutes in face to face in clinical consultation, greater than 50% of which was counseling/coordinating care for left inguinal LN biopsy

## 2016-08-08 NOTE — Procedures (Signed)
[place] core bx LEFT inguinal LAN 18g x3 to surg path No complication No blood loss. See complete dictation in [facility].

## 2016-08-08 NOTE — Discharge Instructions (Signed)
Needle Biopsy, Care After °Introduction °These instructions give you information about caring for yourself after your procedure. Your doctor may also give you more specific instructions. Call your doctor if you have any problems or questions after your procedure. °Follow these instructions at home: °· Rest as told by your doctor. °· Take medicines only as told by your doctor. °· There are many different ways to close and cover the biopsy site, including stitches (sutures), skin glue, and adhesive strips. Follow instructions from your doctor about: °¨ How to take care of your biopsy site. °¨ When and how you should change your bandage (dressing). °¨ When you should remove your dressing. °¨ Removing whatever was used to close your biopsy site. °· Check your biopsy site every day for signs of infection. Watch for: °¨ Redness, swelling, or pain. °¨ Fluid, blood, or pus. °Contact a doctor if: °· You have a fever. °· You have redness, swelling, or pain at the biopsy site, and it lasts longer than a few days. °· You have fluid, blood, or pus coming from the biopsy site. °· You feel sick to your stomach (nauseous). °· You throw up (vomit). °Get help right away if: °· You are short of breath. °· You have trouble breathing. °· Your chest hurts. °· You feel dizzy or you pass out (faint). °· You have bleeding that does not stop with pressure or a bandage. °· You cough up blood. °· Your belly (abdomen) hurts. °This information is not intended to replace advice given to you by your health care provider. Make sure you discuss any questions you have with your health care provider. °Document Released: 06/01/2008 Document Revised: 11/25/2015 Document Reviewed: 06/15/2014 °© 2017 Elsevier °Moderate Conscious Sedation, Adult, Care After °These instructions provide you with information about caring for yourself after your procedure. Your health care provider may also give you more specific instructions. Your treatment has been planned  according to current medical practices, but problems sometimes occur. Call your health care provider if you have any problems or questions after your procedure. °What can I expect after the procedure? °After your procedure, it is common: °· To feel sleepy for several hours. °· To feel clumsy and have poor balance for several hours. °· To have poor judgment for several hours. °· To vomit if you eat too soon. °Follow these instructions at home: °For at least 24 hours after the procedure:  °· Do not: °¨ Participate in activities where you could fall or become injured. °¨ Drive. °¨ Use heavy machinery. °¨ Drink alcohol. °¨ Take sleeping pills or medicines that cause drowsiness. °¨ Make important decisions or sign legal documents. °¨ Take care of children on your own. °· Rest. °Eating and drinking °· Follow the diet recommended by your health care provider. °· If you vomit: °¨ Drink water, juice, or soup when you can drink without vomiting. °¨ Make sure you have little or no nausea before eating solid foods. °General instructions °· Have a responsible adult stay with you until you are awake and alert. °· Take over-the-counter and prescription medicines only as told by your health care provider. °· If you smoke, do not smoke without supervision. °· Keep all follow-up visits as told by your health care provider. This is important. °Contact a health care provider if: °· You keep feeling nauseous or you keep vomiting. °· You feel light-headed. °· You develop a rash. °· You have a fever. °Get help right away if: °· You have trouble breathing. °This information   is not intended to replace advice given to you by your health care provider. Make sure you discuss any questions you have with your health care provider. °Document Released: 04/09/2013 Document Revised: 11/22/2015 Document Reviewed: 10/09/2015 °Elsevier Interactive Patient Education © 2017 [employer] Inc. ° °

## 2016-08-17 ENCOUNTER — Other Ambulatory Visit: Payer: Self-pay | Admitting: Urology

## 2016-08-17 DIAGNOSIS — C61 Malignant neoplasm of prostate: Secondary | ICD-10-CM

## 2016-08-18 ENCOUNTER — Encounter: Payer: Self-pay | Admitting: *Deleted

## 2016-08-22 ENCOUNTER — Telehealth: Payer: Self-pay | Admitting: Oncology

## 2016-08-22 NOTE — Telephone Encounter (Signed)
Spoke to the pt's wife and scheduled appt w/Dr. Alen Blew on 2/21at 11am. Aware to arrive 30 minutes early. Verified the pt's address.

## 2016-08-23 ENCOUNTER — Telehealth: Payer: Self-pay | Admitting: Oncology

## 2016-08-23 ENCOUNTER — Ambulatory Visit (HOSPITAL_BASED_OUTPATIENT_CLINIC_OR_DEPARTMENT_OTHER): Payer: Medicare HMO | Admitting: Oncology

## 2016-08-23 VITALS — BP 148/81 | HR 99 | Temp 97.9°F | Resp 18 | Ht 71.0 in | Wt 131.4 lb

## 2016-08-23 DIAGNOSIS — C61 Malignant neoplasm of prostate: Secondary | ICD-10-CM | POA: Diagnosis not present

## 2016-08-23 DIAGNOSIS — C7951 Secondary malignant neoplasm of bone: Secondary | ICD-10-CM

## 2016-08-23 DIAGNOSIS — C689 Malignant neoplasm of urinary organ, unspecified: Secondary | ICD-10-CM | POA: Insufficient documentation

## 2016-08-23 DIAGNOSIS — C763 Malignant neoplasm of pelvis: Secondary | ICD-10-CM | POA: Diagnosis not present

## 2016-08-23 DIAGNOSIS — Z7189 Other specified counseling: Secondary | ICD-10-CM | POA: Insufficient documentation

## 2016-08-23 NOTE — Progress Notes (Signed)
Reason for Referral: Urothelial carcinoma.   HPI: 83 year old gentleman currently of the [place] area where he lived the majority of his life. He is a pleasant gentleman with history of COPD and prostate cancer. His history of prostate cancer dates back to 2006 but that time he was found to have a PSA of 41. Biopsy at that time showed a Gleason score 4+3 = 7. He was treated with a definitive radiation therapy as well as androgen deprivation therapy. He was under the care of Dr. Janice Norrie during that time. His PSA started to rise and developed biochemical relapse and a ADT was restarted in 2015. His bone scan at that time was clear of disease metastasis. Patient has been followed recently by Dr. Louis Meckel and his most recent PSA on 07/06/2016 was 0.23. In April 2017 his PSA was 0.08.  He started developing left sided inguinal mass that he was able to palpate. He underwent evaluation by Dr. Louis Meckel and a CT scan of the pelvis showed continued left inguinal and external iliac lymphadenopathy. His external iliac lymph node measuring 11 mm. His inguinal lymphadenopathy measuring 2.5 cm. A biopsy obtained on 08/08/2016 which confirmed the presence of advanced carcinoma consistent with urothelial carcinoma. The stains showed negative for PSA and the pattern suggestive of urothelial carcinoma.  Clinically, he reports really no specific symptoms at this time. He did report intermittent hematuria which has resolved. He does report some tenderness around his left inguinal area but no other complaints. His appetite remained about same and does not report any other pain complaints.   He does not report any headaches, blurry vision, syncope or seizures. He does not report any fevers, chills or sweats. He does not report any cough, wheezing or hemoptysis. He does not report any nausea, vomiting or abdominal pain. He does not report any frequency urgency or hesitancy. He does not report any skeletal complaints. Remaining  review of systems unremarkable.   Past Medical History:  Diagnosis Date  . COPD (chronic obstructive pulmonary disease) ([place])   . Hypertension   . Prostate cancer [facility])   :  Past Surgical History:  Procedure Laterality Date  . gastric ulcer repair    . HERNIA REPAIR    :   Current Outpatient Prescriptions:  .  albuterol (PROVENTIL HFA;VENTOLIN HFA) 108 (90 BASE) MCG/ACT inhaler, Inhale into the lungs every 6 (six) hours as needed for wheezing or shortness of breath., Disp: , Rfl:  .  albuterol (PROVENTIL) (2.5 MG/3ML) 0.083% nebulizer solution, Take 2.5 mg by nebulization every 6 (six) hours as needed for wheezing (SOB)., Disp: , Rfl:  .  budesonide-formoterol (SYMBICORT) 160-4.5 MCG/ACT inhaler, Inhale 2 puffs into the lungs 2 (two) times daily., Disp: , Rfl:  .  Calcium Carbonate-Vitamin D (CALCIUM + D PO), Take 1 tablet by mouth daily. Calcium 600 +D   800UI, Disp: , Rfl:  .  esomeprazole (NEXIUM) 40 MG capsule, Take 40 mg by mouth daily at 12 noon., Disp: , Rfl:  .  levofloxacin (LEVAQUIN) 500 MG tablet, Take 1 tablet (500 mg total) by mouth daily., Disp: 5 tablet, Rfl: 0 .  lisinopril (PRINIVIL,ZESTRIL) 10 MG tablet, Take 10 mg by mouth daily., Disp: , Rfl:  .  predniSONE (DELTASONE) 10 MG tablet, Take 3 tablets daily for 2 days, then 2 tablets daily for 2 days, then 1 tablet daily for 2 days then stop., Disp: 12 tablet, Rfl: 0 .  ranitidine (ZANTAC) 75 MG tablet, Take 75 mg by mouth 2 (two) times  daily., Disp: , Rfl:  .  tamsulosin (FLOMAX) 0.4 MG CAPS, Take 0.8 mg by mouth., Disp: , Rfl: :  Allergies  Allergen Reactions  . Demerol [Meperidine] Diarrhea and Nausea And Vomiting  . Promethazine Swelling    Swelling of both legs  :  No family history on file.:  Social History   Social History  . Marital status: Married    Spouse name: N/A  . Number of children: N/A  . Years of education: N/A   Occupational History  . Not on file.   Social History Main Topics  .  Smoking status: Former Smoker    Types: Cigarettes    Quit date: 08/09/2011  . Smokeless tobacco: Never Used  . Alcohol use No  . Drug use: No  . Sexual activity: Not on file   Other Topics Concern  . Not on file   Social History Narrative  . No narrative on file  :  Pertinent items are noted in HPI.  Exam: Blood pressure (!) 148/81, pulse 99, temperature 97.9 F (36.6 C), temperature source Oral, resp. rate 18, height 5\' 11"  (1.803 m), weight 131 lb 6.4 oz (59.6 kg), SpO2 99 %. General appearance: alert and cooperative Head: Normocephalic, without obvious abnormality Throat: lips, mucosa, and tongue normal; teeth and gums normal Neck: no adenopathy Back: negative Resp: clear to auscultation bilaterally Chest wall: no tenderness Cardio: regular rate and rhythm, S1, S2 normal, no murmur, click, rub or gallop GI: soft, non-tender; bowel sounds normal; no masses,  no organomegaly Extremities: extremities normal, atraumatic, no cyanosis or edema Pulses: 2+ and symmetric Skin: Skin color, texture, turgor normal. No rashes or lesions  CBC    Component Value Date/Time   WBC 9.4 08/08/2016 1133   RBC 4.43 08/08/2016 1133   HGB 13.2 08/08/2016 1133   HCT 39.4 08/08/2016 1133   PLT 263 08/08/2016 1133   MCV 88.9 08/08/2016 1133   MCH 29.8 08/08/2016 1133   MCHC 33.5 08/08/2016 1133   RDW 13.6 08/08/2016 1133     Chemistry      Component Value Date/Time   NA 142 08/30/2013 0514   K 3.9 08/30/2013 0514   CL 101 08/30/2013 0514   CO2 27 08/30/2013 0514   BUN 28 (H) 08/30/2013 0514   CREATININE 1.47 (H) 08/30/2013 0514      Component Value Date/Time   CALCIUM 9.3 08/30/2013 0514   ALKPHOS 56 08/29/2013 0534   AST 19 08/29/2013 0534   ALT 16 08/29/2013 0534   BILITOT 0.4 08/29/2013 0534       US Biopsy  Result Date: 08/08/2016 CLINICAL DATA:  History of prostate carcinoma. Left inguinal pelvic adenopathy. EXAM: ULTRASOUND GUIDED CORE BIOPSY OF LEFT INGUINAL  ADENOPATHY MEDICATIONS: Intravenous Fentanyl and Versed were administered as conscious sedation during continuous monitoring of the patient's level of consciousness and physiological / cardiorespiratory status by the radiology RN, with a total moderate sedation time of 10 minutes. PROCEDURE: The procedure, risks, benefits, and alternatives were explained to the patient. Questions regarding the procedure were encouraged and answered. The patient understands and consents to the procedure. Survey ultrasound of the left inguinal region performed. The adenopathy was localized and an appropriate skin entry site was determined and marked. The operative field was prepped with chlorhexidine in a sterile fashion, and a sterile drape was applied covering the operative field. A sterile gown and sterile gloves were used for the procedure. Local anesthesia was provided with 1% Lidocaine. Under real-time ultrasound guidance, a  88 gauge trocar needle was advanced to the margin of the lesion. Once needle tip position was confirmed, coaxial 18-gauge core biopsy samples were obtained, submitted in saline to surgical pathology. The guide needle was removed. The patient tolerated the procedure well. COMPLICATIONS: None. FINDINGS: Left inguinal adenopathy, nodes measuring greater than 3.6 cm, corresponding to CT findings. Representative core biopsy samples obtained as above. IMPRESSION: 1. Technically successful ultrasound-guided core biopsy, left inguinal adenopathy . Electronically Signed   By: Lucrezia Europe M.D.   On: 08/08/2016 13:44    Assessment and Plan:   83 year old gentleman with the following issues:  1. Advanced urothelial carcinoma presented with left inguinal adenopathy. He presented with a mass in the left inguinal canal and CT scan of the pelvis obtained on 07/18/2016 showed a 2.5 cm left inguinal lymph node. He has also a left external lymph node which is enlarged about 11 mm. He is scheduled to have a PET scan for  staging purposes to be completed in the near future.  His case was discussed at the genitourinary tumor board including pathological review as well as imaging studies. These findings were reviewed with the patient today which includes advanced urothelial carcinoma of the genitourinary tract. Given his history of radiation therapy is possible that could be related to that. The exact primary is unclear to me which could be from the bladder, ureter or renal pelvis.  Given the advanced nature of this tumor, no curative options exist at this time. I see no role for primary surgical therapy or radiation therapy unless it is intended for palliative purposes. Systemic therapy is his only option at this time to control his disease. He is not platinum candidate and immune-based therapy is his best option.  Risks and benefits associated with immunotherapy in the form of Pembrolizumab was reviewed today. Fatigue, tiredness, pruritus, hypothyroidism and rarely pneumonitis are associated with this therapy. The benefit would be controlling his disease and prolonging overall survival. The goal of this therapy is palliative not curative.  He is agreeable to attend chemotherapy education class and tentatively start therapy in the first week of March. He will think about it as well and certainly can change his mind if he opted not to proceed with that. Alternatively, supportive care only would be his other option. Without treatment, he understands this cancer will progress and likely causes morbidity and ultimately mortality.  2. Prognosis: This was discussed today and he understands that he has an incurable malignancy. Even with treatment, we are looking at potentially double digit months of survival at this point. Without treatment, this will be probably shorter given his age and his pulmonary status.  3. Prostate cancer: He is receiving androgen deprivation therapy intermittently. His last PSA is 0.23. I do not believe  his prostate cancer will factor into his overall prognosis.  4. Follow-up: The next few weeks to start treatment after PET scan if he wishes to proceed with treatment.

## 2016-08-23 NOTE — Telephone Encounter (Signed)
Appointments scheduled per 2/21 LOS. Patient given AVS report and calendars with future scheduled appointments. °

## 2016-08-23 NOTE — Progress Notes (Signed)
START ON PATHWAY REGIMEN - Bladder  [id number]: Pembrolizumab 200 mg q21 Days Until Progression,  Unacceptable Toxicity, or up to 24 Months    A cycle is 21 days:     Pembrolizumab [facility])) 200 mg flat dose in 50 mL NS IV over 30 minutes every 21 days.  Inline filter required (low-protein binding)       Dose Mod: None         Additional Orders: Severe immune-mediated reactions can occur. See prescribing information for more details and required immediate management with steroids. Monitor thyroid, renal, liver function tests, glucose, and sodium at baseline and before  each dose of pembrolizumab. Ref: Keytruda(R) (pembrolizumab) prescribing information, 2016.  **Always confirm dose/schedule in your pharmacy ordering system**    Patient Characteristics: Metastatic Disease, First Line, No Prior Neoadjuvant/Adjuvant Therapy AJCC Stage Grouping: IV Current evidence of distant metastases? Yes AJCC T Stage: X AJCC M Stage: X AJCC N Stage: X Line of therapy: First Line Would you be surprised if this patient died  in the next year? I would NOT be surprised if this patient died in the next year Prior Neoadjuvant/Adjuvant Therapy? No  Intent of Therapy: Non-Curative / Palliative Intent, Discussed with Patient

## 2016-09-01 ENCOUNTER — Encounter (HOSPITAL_COMMUNITY): Admission: RE | Admit: 2016-09-01 | Payer: Medicare HMO | Source: Ambulatory Visit

## 2016-09-04 ENCOUNTER — Encounter: Payer: Self-pay | Admitting: Oncology

## 2016-09-04 ENCOUNTER — Other Ambulatory Visit: Payer: Medicare HMO

## 2016-09-04 NOTE — Progress Notes (Signed)
Attempted to reach patient to see if interested in applying for assistance for Keytruda's out of pocket. No answer. Was going to try again later to give them a chance to get home and settled. Patient's spouse returned the call and states at this point she does not think they would need or qualify for assistance. She also wanted to know if he does not want to get the treatment tomorrow does he still need labs. Transferred to nurse line. She has my contact name and number for any additional financial questions or concerns.

## 2016-09-05 ENCOUNTER — Other Ambulatory Visit (HOSPITAL_BASED_OUTPATIENT_CLINIC_OR_DEPARTMENT_OTHER): Payer: Medicare HMO

## 2016-09-05 ENCOUNTER — Ambulatory Visit: Payer: Medicare HMO

## 2016-09-05 ENCOUNTER — Ambulatory Visit (HOSPITAL_BASED_OUTPATIENT_CLINIC_OR_DEPARTMENT_OTHER): Payer: Medicare HMO | Admitting: Oncology

## 2016-09-05 ENCOUNTER — Telehealth: Payer: Self-pay | Admitting: Oncology

## 2016-09-05 VITALS — BP 129/77 | HR 107 | Temp 97.7°F | Resp 18 | Ht 71.0 in | Wt 133.6 lb

## 2016-09-05 DIAGNOSIS — C689 Malignant neoplasm of urinary organ, unspecified: Secondary | ICD-10-CM

## 2016-09-05 DIAGNOSIS — C763 Malignant neoplasm of pelvis: Secondary | ICD-10-CM | POA: Diagnosis not present

## 2016-09-05 DIAGNOSIS — C774 Secondary and unspecified malignant neoplasm of inguinal and lower limb lymph nodes: Secondary | ICD-10-CM

## 2016-09-05 LAB — CBC WITH DIFFERENTIAL/PLATELET
BASO%: 0.3 % (ref 0.0–2.0)
BASOS ABS: 0 10*3/uL (ref 0.0–0.1)
EOS ABS: 0.2 10*3/uL (ref 0.0–0.5)
EOS%: 2.5 % (ref 0.0–7.0)
HEMATOCRIT: 37.8 % — AB (ref 38.4–49.9)
HEMOGLOBIN: 12.7 g/dL — AB (ref 13.0–17.1)
LYMPH#: 0.9 10*3/uL (ref 0.9–3.3)
LYMPH%: 10.1 % — ABNORMAL LOW (ref 14.0–49.0)
MCH: 29.7 pg (ref 27.2–33.4)
MCHC: 33.6 g/dL (ref 32.0–36.0)
MCV: 88.3 fL (ref 79.3–98.0)
MONO#: 1.2 10*3/uL — ABNORMAL HIGH (ref 0.1–0.9)
MONO%: 13.7 % (ref 0.0–14.0)
NEUT%: 73.4 % (ref 39.0–75.0)
NEUTROS ABS: 6.4 10*3/uL (ref 1.5–6.5)
Platelets: 218 10*3/uL (ref 140–400)
RBC: 4.28 10*6/uL (ref 4.20–5.82)
RDW: 13.3 % (ref 11.0–14.6)
WBC: 8.8 10*3/uL (ref 4.0–10.3)

## 2016-09-05 LAB — COMPREHENSIVE METABOLIC PANEL
ALK PHOS: 67 U/L (ref 40–150)
ALT: 11 U/L (ref 0–55)
AST: 14 U/L (ref 5–34)
Albumin: 4.2 g/dL (ref 3.5–5.0)
Anion Gap: 10 mEq/L (ref 3–11)
BUN: 17.4 mg/dL (ref 7.0–26.0)
CALCIUM: 9.7 mg/dL (ref 8.4–10.4)
CO2: 25 mEq/L (ref 22–29)
Chloride: 103 mEq/L (ref 98–109)
Creatinine: 1.4 mg/dL — ABNORMAL HIGH (ref 0.7–1.3)
EGFR: 54 mL/min/{1.73_m2} — ABNORMAL LOW (ref >90)
Glucose: 98 mg/dl (ref 70–140)
POTASSIUM: 4.2 meq/L (ref 3.5–5.1)
Sodium: 139 mEq/L (ref 136–145)
Total Bilirubin: 0.38 mg/dL (ref 0.20–1.20)
Total Protein: 7.3 g/dL (ref 6.4–8.3)

## 2016-09-05 NOTE — Telephone Encounter (Signed)
Gave patient avs report and appointments for March and April.  °

## 2016-09-05 NOTE — Progress Notes (Signed)
Hematology and Oncology Follow Up Visit  Larry Stanley [id number] [birth year] 83 y.o. 09/05/2016 10:52 AM Larry Stanley, MDAvbuere, Christean Grief, MD   Principle Diagnosis: 83 year old gentleman advanced urothelial carcinoma presenting with left inguinal adenopathy. This was documented by biopsy in January 2018.   Prior Therapy: Status post CT-guided biopsy of a 2.5 cm left inguinal node in January 2018.  Current therapy: Under evaluation to start systemic therapy with Pembrolizumab.   Interim History: [name] presents today for a follow-up visit. Since the last visit, he reports no major changes in his health. He remains active and attends to activities of daily living. He denied any falls or syncope. He denied any abdominal pain, gross satiety or constitutional symptoms. He denies any hematuria or dysuria. He was supposed to undergo PET CT scan which was delayed until March 14.  He does not report any headaches, blurry vision, syncope or seizures. He does not report any fevers, chills or sweats. He does not report any cough, wheezing or hemoptysis. He does not report any nausea, vomiting or abdominal pain. He does not report any frequency urgency or hesitancy. He does not report any skeletal complaints. Remaining review of systems unremarkable.   Medications: I have reviewed the patient's current medications.  Current Outpatient Prescriptions  Medication Sig Dispense Refill  . albuterol (PROVENTIL HFA;VENTOLIN HFA) 108 (90 BASE) MCG/ACT inhaler Inhale into the lungs every 6 (six) hours as needed for wheezing or shortness of breath.    Marland Kitchen albuterol (PROVENTIL) (2.5 MG/3ML) 0.083% nebulizer solution Take 2.5 mg by nebulization every 6 (six) hours as needed for wheezing (SOB).    . budesonide-formoterol (SYMBICORT) 160-4.5 MCG/ACT inhaler Inhale 2 puffs into the lungs 2 (two) times daily.    . Calcium Carbonate-Vitamin D (CALCIUM + D PO) Take 1 tablet by mouth daily. Calcium 600 +D   800UI    .  esomeprazole (NEXIUM) 40 MG capsule Take 40 mg by mouth daily at 12 noon.    Marland Kitchen levofloxacin (LEVAQUIN) 500 MG tablet Take 1 tablet (500 mg total) by mouth daily. 5 tablet 0  . lisinopril (PRINIVIL,ZESTRIL) 10 MG tablet Take 10 mg by mouth daily.    . predniSONE (DELTASONE) 10 MG tablet Take 3 tablets daily for 2 days, then 2 tablets daily for 2 days, then 1 tablet daily for 2 days then stop. 12 tablet 0  . ranitidine (ZANTAC) 75 MG tablet Take 75 mg by mouth 2 (two) times daily.    . tamsulosin (FLOMAX) 0.4 MG CAPS Take 0.8 mg by mouth.     No current facility-administered medications for this visit.      Allergies:  Allergies  Allergen Reactions  . Demerol [Meperidine] Diarrhea and Nausea And Vomiting  . Promethazine Swelling    Swelling of both legs    Past Medical History, Surgical history, Social history, and Family History were reviewed and updated.   Blood pressure 129/77, pulse (!) 107, temperature 97.7 F (36.5 C), temperature source Oral, resp. rate 18, height 5\' 11"  (1.803 m), weight 133 lb 9.6 oz (60.6 kg), SpO2 98 %. ECOG: 1 General appearance: alert and cooperative Head: Normocephalic, without obvious abnormality Neck: no adenopathy Lymph nodes: Cervical, supraclavicular, and axillary nodes normal. Heart:regular rate and rhythm, S1, S2 normal, no murmur, click, rub or gallop Lung:chest clear, no wheezing, rales, normal symmetric air entry. Abdomin: soft, non-tender, without masses or organomegaly EXT:no erythema, induration, or nodules   Lab Results: Lab Results  Component Value Date   WBC 8.8 09/05/2016  HGB 12.7 (L) 09/05/2016   HCT 37.8 (L) 09/05/2016   MCV 88.3 09/05/2016   PLT 218 09/05/2016     Chemistry      Component Value Date/Time   NA 142 08/30/2013 0514   K 3.9 08/30/2013 0514   CL 101 08/30/2013 0514   CO2 27 08/30/2013 0514   BUN 28 (H) 08/30/2013 0514   CREATININE 1.47 (H) 08/30/2013 0514      Component Value Date/Time   CALCIUM 9.3  08/30/2013 0514   ALKPHOS 56 08/29/2013 0534   AST 19 08/29/2013 0534   ALT 16 08/29/2013 0534   BILITOT 0.4 08/29/2013 0534       Impression and Plan:  83 year old gentleman with the following issues:  1. Advanced urothelial carcinoma presented with left inguinal adenopathy. He presented with a mass in the left inguinal canal and CT scan of the pelvis obtained on 07/18/2016 showed a 2.5 cm left inguinal lymph node. He has also a left external lymph node which is enlarged about 11 mm. He is scheduled to have a PET scan for staging purposes to be completed in the near future.  He was scheduled to start systemic therapy at this time I would like to defer that option after that CT scan. I explained to him that that should not alter our management that he is not sure he wants to have systemic treatment at this time.  After discussion today, he is agreeable to return after the PET scan to discuss the results and potentially start treatment at that time.  2. Follow-up: Will be in 3 weeks after the PET scan and presumably start Martin Majestic, MD 3/6/201810:52 AM

## 2016-09-13 ENCOUNTER — Encounter (HOSPITAL_COMMUNITY)
Admission: RE | Admit: 2016-09-13 | Discharge: 2016-09-13 | Disposition: A | Discharge status: Home / Self Care | Payer: Medicare HMO | Source: Ambulatory Visit | Attending: Urology | Admitting: Urology

## 2016-09-13 DIAGNOSIS — C61 Malignant neoplasm of prostate: Secondary | ICD-10-CM | POA: Insufficient documentation

## 2016-09-13 LAB — GLUCOSE, CAPILLARY: GLUCOSE-CAPILLARY: 130 mg/dL — AB (ref 65–99)

## 2016-09-13 MED ORDER — FLUDEOXYGLUCOSE F - 18 (FDG) INJECTION
6.6300 | Freq: Once | INTRAVENOUS | Status: AC | PRN
  Administered 2016-09-13: 6.63 via INTRAVENOUS

## 2016-09-15 ENCOUNTER — Telehealth: Payer: Self-pay | Admitting: Oncology

## 2016-09-15 NOTE — Telephone Encounter (Signed)
Faxed records to [facility] °

## 2016-09-26 ENCOUNTER — Other Ambulatory Visit (HOSPITAL_BASED_OUTPATIENT_CLINIC_OR_DEPARTMENT_OTHER): Payer: Medicare HMO

## 2016-09-26 ENCOUNTER — Ambulatory Visit: Payer: Medicare HMO

## 2016-09-26 ENCOUNTER — Ambulatory Visit (HOSPITAL_BASED_OUTPATIENT_CLINIC_OR_DEPARTMENT_OTHER): Payer: Medicare HMO | Admitting: Oncology

## 2016-09-26 VITALS — BP 114/77 | HR 121 | Temp 98.7°F | Resp 18 | Ht 71.0 in | Wt 132.8 lb

## 2016-09-26 DIAGNOSIS — C763 Malignant neoplasm of pelvis: Secondary | ICD-10-CM

## 2016-09-26 DIAGNOSIS — C774 Secondary and unspecified malignant neoplasm of inguinal and lower limb lymph nodes: Secondary | ICD-10-CM

## 2016-09-26 DIAGNOSIS — C689 Malignant neoplasm of urinary organ, unspecified: Secondary | ICD-10-CM

## 2016-09-26 LAB — CBC WITH DIFFERENTIAL/PLATELET
BASO%: 0.7 % (ref 0.0–2.0)
BASOS ABS: 0.1 10*3/uL (ref 0.0–0.1)
EOS%: 1.1 % (ref 0.0–7.0)
Eosinophils Absolute: 0.2 10*3/uL (ref 0.0–0.5)
HCT: 38.5 % (ref 38.4–49.9)
HGB: 13.1 g/dL (ref 13.0–17.1)
LYMPH#: 1.1 10*3/uL (ref 0.9–3.3)
LYMPH%: 7.2 % — AB (ref 14.0–49.0)
MCH: 30 pg (ref 27.2–33.4)
MCHC: 34 g/dL (ref 32.0–36.0)
MCV: 88.4 fL (ref 79.3–98.0)
MONO#: 2.1 10*3/uL — AB (ref 0.1–0.9)
MONO%: 13.6 % (ref 0.0–14.0)
NEUT%: 77.4 % — AB (ref 39.0–75.0)
NEUTROS ABS: 12.1 10*3/uL — AB (ref 1.5–6.5)
Platelets: 273 10*3/uL (ref 140–400)
RBC: 4.35 10*6/uL (ref 4.20–5.82)
RDW: 13.4 % (ref 11.0–14.6)
WBC: 15.6 10*3/uL — ABNORMAL HIGH (ref 4.0–10.3)

## 2016-09-26 LAB — COMPREHENSIVE METABOLIC PANEL
ALT: 11 U/L (ref 0–55)
AST: 13 U/L (ref 5–34)
Albumin: 4.2 g/dL (ref 3.5–5.0)
Alkaline Phosphatase: 60 U/L (ref 40–150)
Anion Gap: 10 mEq/L (ref 3–11)
BILIRUBIN TOTAL: 0.35 mg/dL (ref 0.20–1.20)
BUN: 20 mg/dL (ref 7.0–26.0)
CHLORIDE: 102 meq/L (ref 98–109)
CO2: 25 meq/L (ref 22–29)
CREATININE: 1.5 mg/dL — AB (ref 0.7–1.3)
Calcium: 9.9 mg/dL (ref 8.4–10.4)
EGFR: 49 mL/min/{1.73_m2} — AB (ref >90)
GLUCOSE: 114 mg/dL (ref 70–140)
Potassium: 4.2 mEq/L (ref 3.5–5.1)
SODIUM: 136 meq/L (ref 136–145)
TOTAL PROTEIN: 7.6 g/dL (ref 6.4–8.3)

## 2016-09-26 NOTE — Progress Notes (Signed)
Hematology and Oncology Follow Up Visit  Larry Stanley [id number] 11-18-1933 83 y.o. 09/26/2016 10:28 AM Larry Stanley, MDAvbuere, Larry Grief, MD   Principle Diagnosis: 83 year old gentleman advanced urothelial carcinoma presenting with left inguinal adenopathy. This was documented by biopsy in January 2018.   Prior Therapy: Status post CT-guided biopsy of a 2.5 cm left inguinal node in January 2018.  Current therapy: Consideration for systemic therapy but he has refused.  Interim History: Larry Stanley presents today for a follow-up visit. Since the last visit, he reports no recent complaints. He denied any falls or syncope. He denied any abdominal pain, gross satiety or constitutional symptoms. He denies any hematuria or dysuria. He denied any falls or decline in his performance status or activity level. He continues to enjoy a reasonable quality of life.  He does not report any headaches, blurry vision, syncope or seizures. He does not report any fevers, chills or sweats. He does not report any cough, wheezing or hemoptysis. He does not report any nausea, vomiting or abdominal pain. He does not report any frequency urgency or hesitancy. He does not report any skeletal complaints. Remaining review of systems unremarkable.   Medications: I have reviewed the patient's current medications.  Current Outpatient Prescriptions  Medication Sig Dispense Refill  . albuterol (PROVENTIL HFA;VENTOLIN HFA) 108 (90 BASE) MCG/ACT inhaler Inhale into the lungs every 6 (six) hours as needed for wheezing or shortness of breath.    Marland Kitchen albuterol (PROVENTIL) (2.5 MG/3ML) 0.083% nebulizer solution Take 2.5 mg by nebulization every 6 (six) hours as needed for wheezing (SOB).    . budesonide-formoterol (SYMBICORT) 160-4.5 MCG/ACT inhaler Inhale 2 puffs into the lungs 2 (two) times daily.    . Calcium Carbonate-Vitamin D (CALCIUM + D PO) Take 1 tablet by mouth daily. Calcium 600 +D   800UI    . esomeprazole (NEXIUM) 40  MG capsule Take 40 mg by mouth daily at 12 noon.    Marland Kitchen levofloxacin (LEVAQUIN) 500 MG tablet Take 1 tablet (500 mg total) by mouth daily. 5 tablet 0  . lisinopril (PRINIVIL,ZESTRIL) 10 MG tablet Take 10 mg by mouth daily.    . predniSONE (DELTASONE) 10 MG tablet Take 3 tablets daily for 2 days, then 2 tablets daily for 2 days, then 1 tablet daily for 2 days then stop. 12 tablet 0  . ranitidine (ZANTAC) 75 MG tablet Take 75 mg by mouth 2 (two) times daily.    . tamsulosin (FLOMAX) 0.4 MG CAPS Take 0.8 mg by mouth.     No current facility-administered medications for this visit.      Allergies:  Allergies  Allergen Reactions  . Demerol [Meperidine] Diarrhea and Nausea And Vomiting  . Promethazine Swelling    Swelling of both legs    Past Medical History, Surgical history, Social history, and Family History were reviewed and updated.   Blood pressure 114/77, pulse (!) 121, temperature 98.7 F (37.1 C), temperature source Oral, resp. rate 18, height 5\' 11"  (1.803 m), weight 132 lb 12.8 oz (60.2 kg), SpO2 97 %. ECOG: 1 General appearance: alert and cooperative frail without distress. Head: Normocephalic, without obvious abnormality oral ulcers or lesions. Neck: no adenopathy Lymph nodes: Cervical, supraclavicular, and axillary nodes normal. Heart:regular rate and rhythm, S1, S2 normal, no murmur, click, rub or gallop Lung:chest clear, no wheezing, rales, normal symmetric air entry. Abdomin: soft, non-tender, without masses or organomegaly no shifting dullness or ascites. EXT:no erythema, induration, or nodules   Lab Results: Lab Results  Component Value  Date   WBC 15.6 (H) 09/26/2016   HGB 13.1 09/26/2016   HCT 38.5 09/26/2016   MCV 88.4 09/26/2016   PLT 273 09/26/2016     Chemistry      Component Value Date/Time   NA 136 09/26/2016 0944   K 4.2 09/26/2016 0944   CL 101 08/30/2013 0514   CO2 25 09/26/2016 0944   BUN 20.0 09/26/2016 0944   CREATININE 1.5 (H) 09/26/2016  0944      Component Value Date/Time   CALCIUM 9.9 09/26/2016 0944   ALKPHOS 60 09/26/2016 0944   AST 13 09/26/2016 0944   ALT 11 09/26/2016 0944   BILITOT 0.35 09/26/2016 0944     EXAM: NUCLEAR MEDICINE PET WHOLE BODY  TECHNIQUE: 6.6 mCi F-18 FDG was injected intravenously. Full-ring PET imaging was performed from the vertex to the feet after the radiotracer. CT data was obtained and used for attenuation correction and anatomic localization.  FASTING BLOOD GLUCOSE:  Value: 130 mg/dl  COMPARISON:  Multiple exams, including CT pelvis from 07/18/2016  FINDINGS: HEAD/NECK  No hypermetabolic activity in the scalp. No hypermetabolic cervical lymph nodes.  CHEST  No hypermetabolic mediastinal or hilar nodes. No suspicious pulmonary nodules on the CT data.  Centrilobular emphysema. Atherosclerotic calcification of the coronary arteries and aortic arch. Mild bilateral airway thickening. Small anterior pericardial effusion.  ABDOMEN/PELVIS  No abnormal hypermetabolic activity within the liver, pancreas, adrenal glands, or spleen. Hypermetabolic and pathologic periaortic adenopathy noted. An index left periaortic node measures 1.4 cm in short axis on image 161/3 and has a maximum standard uptake value of 8.3. Other similar periaortic and aortocaval lymph nodes are present in the upper abdomen.  A left common iliac node measuring 1.1 cm in short axis on image 181/3 has a maximum SUV of 7.2. This lesion measured 1.3 cm in short axis on the recent pelvic CT of 07/18/2016.  A left external iliac/pelvic sidewall lymph node measures 2.0 cm in short axis on image 196/3 (formerly 1.1 cm) and has a maximum SUV of 12.5  Conglomerate bulky left inguinal adenopathy is present with a maximum SUV of 14.7. The lower nodal component of this measures 2.9 cm in short axis (formerly 2.3 cm).  Prostate seed implants are present.  Postoperative findings along the stomach.  Hypodense renal lesions are probably cysts and primarily appear to be photopenic.  Aortoiliac atherosclerotic vascular disease. Scattered sigmoid colon diverticula.  SKELETON  No focal hypermetabolic activity to suggest skeletal metastasis.  EXTREMITIES  No abnormal hypermetabolic activity in the lower extremities.  IMPRESSION: 1. Hypermetabolic and abnormally enlarged peri aortic, left common iliac, left external iliac, and left inguinal adenopathy compatible with malignancy. 2. Seed implants in the prostate gland. 3. Bilateral airway thickening may reflect bronchitis. 4. Other imaging findings of potential clinical significance: Emphysema. Small anterior pericardial effusion. Hypodense renal lesions are probably cysts. Coronary and aortic atherosclerosis. Scattered sigmoid colon diverticula.   Impression and Plan:  83 year old gentleman with the following issues:  1. Advanced urothelial carcinoma presented with left inguinal adenopathy. He presented with a mass in the left inguinal canal and CT scan of the pelvis obtained on 07/18/2016 showed a 2.5 cm left inguinal lymph node. He has also a left external lymph node which is enlarged about 11 mm. He is scheduled to have a PET scan for staging purposes to be completed in the near future.  PET CT scan obtained on 09/13/2016 was reviewed with the patient and clearly shows systemic disease with a large  periaortic common iliac and inguinal adenopathy.  The natural course of this disease was reviewed again and the option of systemic therapy was discussed today. He continues to refuse systemic therapy or any definitive therapy at this time. He feels that his quality of life is already reasonable and reluctant not to interfere with that. I explained to him that given the nature of his advanced malignancy he will develop more symptoms and discomfort associated with this cancer. He probably will suffer more decline in his performance  status and possibly pain. He understand this possibility and continues to refuse therapy. I also explained to him that he might require hospice enrollment if he comes debilitated from this malignancy.  2. Prognosis: Poor without any treatments. He understands he will require hospice in the future although he might not qualify at the moment.  I am happy to see him in the future as needed. I urged him to contact Dr. Louis Meckel with any questions from a urological issues.  [facility], MD 3/27/201810:28 AM

## 2016-10-17 ENCOUNTER — Emergency Department (HOSPITAL_COMMUNITY): Payer: Medicare HMO

## 2016-10-17 ENCOUNTER — Encounter (HOSPITAL_COMMUNITY): Payer: Self-pay | Admitting: *Deleted

## 2016-10-17 ENCOUNTER — Ambulatory Visit: Payer: Medicare HMO | Admitting: Oncology

## 2016-10-17 ENCOUNTER — Inpatient Hospital Stay (HOSPITAL_COMMUNITY)
Admission: EM | Admit: 2016-10-17 | Discharge: 2016-10-23 | DRG: 871 | Disposition: A | Discharge status: Hospice | Payer: Medicare HMO | Attending: Internal Medicine | Admitting: Internal Medicine

## 2016-10-17 ENCOUNTER — Ambulatory Visit: Payer: Medicare HMO

## 2016-10-17 ENCOUNTER — Other Ambulatory Visit: Payer: Medicare HMO

## 2016-10-17 DIAGNOSIS — E43 Unspecified severe protein-calorie malnutrition: Secondary | ICD-10-CM | POA: Diagnosis present

## 2016-10-17 DIAGNOSIS — I6201 Nontraumatic acute subdural hemorrhage: Secondary | ICD-10-CM | POA: Diagnosis present

## 2016-10-17 DIAGNOSIS — C7989 Secondary malignant neoplasm of other specified sites: Secondary | ICD-10-CM | POA: Diagnosis present

## 2016-10-17 DIAGNOSIS — C680 Malignant neoplasm of urethra: Secondary | ICD-10-CM | POA: Diagnosis present

## 2016-10-17 DIAGNOSIS — J449 Chronic obstructive pulmonary disease, unspecified: Secondary | ICD-10-CM | POA: Diagnosis present

## 2016-10-17 DIAGNOSIS — I6203 Nontraumatic chronic subdural hemorrhage: Secondary | ICD-10-CM | POA: Diagnosis present

## 2016-10-17 DIAGNOSIS — C689 Malignant neoplasm of urinary organ, unspecified: Secondary | ICD-10-CM | POA: Diagnosis present

## 2016-10-17 DIAGNOSIS — C61 Malignant neoplasm of prostate: Secondary | ICD-10-CM | POA: Diagnosis present

## 2016-10-17 DIAGNOSIS — A419 Sepsis, unspecified organism: Secondary | ICD-10-CM | POA: Diagnosis present

## 2016-10-17 DIAGNOSIS — Z8711 Personal history of peptic ulcer disease: Secondary | ICD-10-CM

## 2016-10-17 DIAGNOSIS — Z515 Encounter for palliative care: Secondary | ICD-10-CM | POA: Diagnosis not present

## 2016-10-17 DIAGNOSIS — G9340 Encephalopathy, unspecified: Secondary | ICD-10-CM | POA: Diagnosis not present

## 2016-10-17 DIAGNOSIS — N183 Chronic kidney disease, stage 3 unspecified: Secondary | ICD-10-CM | POA: Diagnosis present

## 2016-10-17 DIAGNOSIS — I129 Hypertensive chronic kidney disease with stage 1 through stage 4 chronic kidney disease, or unspecified chronic kidney disease: Secondary | ICD-10-CM | POA: Diagnosis present

## 2016-10-17 DIAGNOSIS — C799 Secondary malignant neoplasm of unspecified site: Secondary | ICD-10-CM

## 2016-10-17 DIAGNOSIS — K219 Gastro-esophageal reflux disease without esophagitis: Secondary | ICD-10-CM | POA: Diagnosis present

## 2016-10-17 DIAGNOSIS — Z7951 Long term (current) use of inhaled steroids: Secondary | ICD-10-CM | POA: Diagnosis not present

## 2016-10-17 DIAGNOSIS — I6200 Nontraumatic subdural hemorrhage, unspecified: Secondary | ICD-10-CM | POA: Diagnosis not present

## 2016-10-17 DIAGNOSIS — S065XAA Traumatic subdural hemorrhage with loss of consciousness status unknown, initial encounter: Secondary | ICD-10-CM

## 2016-10-17 DIAGNOSIS — G9349 Other encephalopathy: Secondary | ICD-10-CM | POA: Diagnosis present

## 2016-10-17 DIAGNOSIS — Z79899 Other long term (current) drug therapy: Secondary | ICD-10-CM

## 2016-10-17 DIAGNOSIS — S065X9A Traumatic subdural hemorrhage with loss of consciousness of unspecified duration, initial encounter: Secondary | ICD-10-CM | POA: Diagnosis present

## 2016-10-17 DIAGNOSIS — Z66 Do not resuscitate: Secondary | ICD-10-CM | POA: Diagnosis present

## 2016-10-17 DIAGNOSIS — Z87891 Personal history of nicotine dependence: Secondary | ICD-10-CM | POA: Diagnosis not present

## 2016-10-17 DIAGNOSIS — I10 Essential (primary) hypertension: Secondary | ICD-10-CM | POA: Diagnosis present

## 2016-10-17 LAB — CBC WITH DIFFERENTIAL/PLATELET
BASOS ABS: 0 10*3/uL (ref 0.0–0.1)
Basophils Relative: 0 %
Eosinophils Absolute: 0 10*3/uL (ref 0.0–0.7)
Eosinophils Relative: 0 %
HEMATOCRIT: 36.3 % — AB (ref 39.0–52.0)
Hemoglobin: 12 g/dL — ABNORMAL LOW (ref 13.0–17.0)
Lymphocytes Relative: 6 %
Lymphs Abs: 0.7 10*3/uL (ref 0.7–4.0)
MCH: 28.8 pg (ref 26.0–34.0)
MCHC: 33.1 g/dL (ref 30.0–36.0)
MCV: 87.3 fL (ref 78.0–100.0)
MONO ABS: 0.8 10*3/uL (ref 0.1–1.0)
Monocytes Relative: 7 %
NEUTROS ABS: 10 10*3/uL — AB (ref 1.7–7.7)
Neutrophils Relative %: 87 %
Platelets: 349 10*3/uL (ref 150–400)
RBC: 4.16 MIL/uL — AB (ref 4.22–5.81)
RDW: 12.6 % (ref 11.5–15.5)
WBC: 11.6 10*3/uL — ABNORMAL HIGH (ref 4.0–10.5)

## 2016-10-17 LAB — COMPREHENSIVE METABOLIC PANEL
ALBUMIN: 4.1 g/dL (ref 3.5–5.0)
ALT: 14 U/L — ABNORMAL LOW (ref 17–63)
ANION GAP: 13 (ref 5–15)
AST: 17 U/L (ref 15–41)
Alkaline Phosphatase: 51 U/L (ref 38–126)
BILIRUBIN TOTAL: 0.8 mg/dL (ref 0.3–1.2)
BUN: 23 mg/dL — ABNORMAL HIGH (ref 6–20)
CO2: 23 mmol/L (ref 22–32)
Calcium: 8.9 mg/dL (ref 8.9–10.3)
Chloride: 99 mmol/L — ABNORMAL LOW (ref 101–111)
Creatinine, Ser: 1.29 mg/dL — ABNORMAL HIGH (ref 0.61–1.24)
GFR calc Af Amer: 59 mL/min — ABNORMAL LOW (ref >60)
GFR calc non Af Amer: 51 mL/min — ABNORMAL LOW (ref >60)
GLUCOSE: 133 mg/dL — AB (ref 65–99)
POTASSIUM: 4.1 mmol/L (ref 3.5–5.1)
SODIUM: 135 mmol/L (ref 135–145)
TOTAL PROTEIN: 7.8 g/dL (ref 6.5–8.1)

## 2016-10-17 LAB — URINALYSIS, ROUTINE W REFLEX MICROSCOPIC
BACTERIA UA: NONE SEEN
BILIRUBIN URINE: NEGATIVE
Glucose, UA: NEGATIVE mg/dL
KETONES UR: 5 mg/dL — AB
LEUKOCYTES UA: NEGATIVE
Nitrite: NEGATIVE
Protein, ur: NEGATIVE mg/dL
SQUAMOUS EPITHELIAL / LPF: NONE SEEN
Specific Gravity, Urine: >1.046 — ABNORMAL HIGH (ref 1.005–1.030)
pH: 5 (ref 5.0–8.0)

## 2016-10-17 LAB — BLOOD GAS, ARTERIAL
ACID-BASE DEFICIT: 1.5 mmol/L (ref 0.0–2.0)
BICARBONATE: 22.1 mmol/L (ref 20.0–28.0)
Drawn by: 257701
O2 Saturation: 94.3 %
PATIENT TEMPERATURE: 100.8
pCO2 arterial: 37.9 mmHg (ref 32.0–48.0)
pH, Arterial: 7.39 (ref 7.350–7.450)
pO2, Arterial: 80.9 mmHg — ABNORMAL LOW (ref 83.0–108.0)

## 2016-10-17 LAB — LACTIC ACID, PLASMA: Lactic Acid, Venous: 1.1 mmol/L (ref 0.5–1.9)

## 2016-10-17 LAB — I-STAT TROPONIN, ED: TROPONIN I, POC: 0 ng/mL (ref 0.00–0.08)

## 2016-10-17 LAB — I-STAT CG4 LACTIC ACID, ED: LACTIC ACID, VENOUS: 1.35 mmol/L (ref 0.5–1.9)

## 2016-10-17 LAB — PROCALCITONIN: Procalcitonin: 10.66 ng/mL

## 2016-10-17 LAB — LIPASE, BLOOD: Lipase: 24 U/L (ref 11–51)

## 2016-10-17 MED ORDER — SODIUM CHLORIDE 0.9% FLUSH
3.0000 mL | Freq: Two times a day (BID) | INTRAVENOUS | Status: DC
  Administered 2016-10-17 – 2016-10-23 (×11): 3 mL via INTRAVENOUS

## 2016-10-17 MED ORDER — IPRATROPIUM BROMIDE 0.02 % IN SOLN
0.5000 mg | RESPIRATORY_TRACT | Status: DC
  Administered 2016-10-17: 0.5 mg via RESPIRATORY_TRACT
  Filled 2016-10-17: qty 2.5

## 2016-10-17 MED ORDER — LEVALBUTEROL HCL 1.25 MG/0.5ML IN NEBU
1.2500 mg | INHALATION_SOLUTION | Freq: Three times a day (TID) | RESPIRATORY_TRACT | Status: DC
  Administered 2016-10-18 – 2016-10-23 (×15): 1.25 mg via RESPIRATORY_TRACT
  Filled 2016-10-17 (×20): qty 0.5

## 2016-10-17 MED ORDER — FAMOTIDINE IN NACL 20-0.9 MG/50ML-% IV SOLN
20.0000 mg | Freq: Two times a day (BID) | INTRAVENOUS | Status: DC
  Administered 2016-10-17 – 2016-10-20 (×6): 20 mg via INTRAVENOUS
  Filled 2016-10-17 (×7): qty 50

## 2016-10-17 MED ORDER — ACETAMINOPHEN 650 MG RE SUPP: 650.0000 mg | Freq: Four times a day (QID) | RECTAL | Status: DC | PRN

## 2016-10-17 MED ORDER — ONDANSETRON HCL 4 MG/2ML IJ SOLN
4.0000 mg | Freq: Three times a day (TID) | INTRAMUSCULAR | Status: DC | PRN
  Administered 2016-10-18: 4 mg via INTRAVENOUS
  Filled 2016-10-17: qty 2

## 2016-10-17 MED ORDER — VANCOMYCIN HCL IN DEXTROSE 750-5 MG/150ML-% IV SOLN
750.0000 mg | INTRAVENOUS | Status: DC
  Filled 2016-10-17: qty 150

## 2016-10-17 MED ORDER — IPRATROPIUM BROMIDE 0.02 % IN SOLN
0.5000 mg | Freq: Three times a day (TID) | RESPIRATORY_TRACT | Status: DC
  Administered 2016-10-18 – 2016-10-23 (×15): 0.5 mg via RESPIRATORY_TRACT
  Filled 2016-10-17 (×15): qty 2.5

## 2016-10-17 MED ORDER — HYDRALAZINE HCL 20 MG/ML IJ SOLN: 5.0000 mg | INTRAMUSCULAR | Status: DC | PRN

## 2016-10-17 MED ORDER — IOPAMIDOL (ISOVUE-300) INJECTION 61%
INTRAVENOUS | Status: AC
  Filled 2016-10-17: qty 100

## 2016-10-17 MED ORDER — IOPAMIDOL (ISOVUE-370) INJECTION 76%
INTRAVENOUS | Status: AC
  Filled 2016-10-17: qty 100

## 2016-10-17 MED ORDER — IOPAMIDOL (ISOVUE-300) INJECTION 61%
100.0000 mL | Freq: Once | INTRAVENOUS | Status: AC | PRN
  Administered 2016-10-17: 100 mL via INTRAVENOUS

## 2016-10-17 MED ORDER — LEVALBUTEROL HCL 1.25 MG/0.5ML IN NEBU
1.2500 mg | INHALATION_SOLUTION | Freq: Four times a day (QID) | RESPIRATORY_TRACT | Status: DC
  Administered 2016-10-17: 1.25 mg via RESPIRATORY_TRACT
  Filled 2016-10-17: qty 0.5

## 2016-10-17 MED ORDER — VANCOMYCIN HCL IN DEXTROSE 1-5 GM/200ML-% IV SOLN
1000.0000 mg | Freq: Once | INTRAVENOUS | Status: AC
Start: 2016-10-17 — End: 2016-10-17
  Administered 2016-10-17: 1000 mg via INTRAVENOUS
  Filled 2016-10-17: qty 200

## 2016-10-17 MED ORDER — ACETAMINOPHEN 325 MG PO TABS: 650.0000 mg | ORAL_TABLET | Freq: Four times a day (QID) | ORAL | Status: DC | PRN

## 2016-10-17 MED ORDER — SODIUM CHLORIDE 0.9 % IV BOLUS (SEPSIS)
1500.0000 mL | Freq: Once | INTRAVENOUS | Status: AC
Start: 2016-10-17 — End: 2016-10-17
  Administered 2016-10-17: 1500 mL via INTRAVENOUS

## 2016-10-17 MED ORDER — PIPERACILLIN-TAZOBACTAM 3.375 G IVPB 30 MIN
3.3750 g | Freq: Once | INTRAVENOUS | Status: AC
  Administered 2016-10-17: 3.375 g via INTRAVENOUS
  Filled 2016-10-17: qty 50

## 2016-10-17 MED ORDER — SODIUM CHLORIDE 0.9 % IV SOLN
INTRAVENOUS | Status: DC
  Administered 2016-10-17 – 2016-10-19 (×5): via INTRAVENOUS

## 2016-10-17 NOTE — ED Notes (Signed)
Bed: WA01 Expected date:  Expected time:  Means of arrival:  Comments: EMS 82s AMS, febrile

## 2016-10-17 NOTE — H&P (Signed)
History and Physical    Larry Stanley YTK:[id number] DOB: Oct 24, 1933 DOA: 10/17/2016  Referring MD/NP/PA:   PCP: Philis Fendt, MD   Patient coming from:  The patient is coming from home.  At baseline, pt is partially dependent for most of ADL.  Chief Complaint: AMS  HPI: Larry Stanley is a 83 y.o. male with medical history significant of COPD, end stage of prostate cancer with invasive urethral and urothelial cancer (s/p of placement of radiation seeds), hypertension, GERD, CKD-III, who presents with AMS.  Per his wife, pt has been declining for the past several days with increased weakness and refusal to eat.  The patient is more confused and was less talkative since yesterday, and stopped talking today. Per his wife, patient does not seem to have nausea, vomiting, diarrhea or abdominal pain, chest pain. He has history of COPD with shortness breath and mild cough which is at his baseline per his wife. He has chronic difficult urinating, not sure if he has any symptoms of UTI. Patient does not have facial droop, slurred speech, unilateral weakness per his wife. No fall witnessed per his wife.  ED Course: pt was found to have WBC 11.6, lactate 1.35, negative troponin, lipase 24, stable renal function, pending urinalysis, negative chest x-ray. Pt is admitted to telemetry bed as inpatient.  # CT abdomen/pelvis showed: 1. Large multilobulated left inguinal mass noted, measuring 7.7 x 4.4 x 3.4 cm, reflecting metastatic disease. 2. Enlarged retroperitoneal nodes measure up to 1.7 cm in short axis. Enlarged left pelvic sidewall node measures 1.9 cm in short axis. 3. Diffuse diverticulosis along the distal transverse, descending and sigmoid colon, without evidence of diverticulitis. 4. Diffuse aortic atherosclerosis. 5. Scattered small nonspecific hypodensities within the liver, measuring up to 9 mm in size. 6. Bilateral renal cysts seen.  #CT head showed: 1. Acute on subacute left  subdural hematoma is identified which exerts mass effect upon the underlying frontal lobe. 2. Chronic right frontal lobe subdural hematoma.   Review of Systems: Could not be reviewed due to altered mental status.  Allergy:  Allergies  Allergen Reactions  . Demerol [Meperidine] Diarrhea and Nausea And Vomiting  . Promethazine Swelling    Swelling of both legs    Past Medical History:  Diagnosis Date  . COPD (chronic obstructive pulmonary disease) ([employer])   . Hypertension   . Prostate cancer [facility])     Past Surgical History:  Procedure Laterality Date  . gastric ulcer repair    . HERNIA REPAIR      Social History:  reports that he quit smoking about 5 years ago. His smoking use included Cigarettes. He has never used smokeless tobacco. He reports that he does not drink alcohol or use drugs.  Family History: Could not be reviewed due to altered mental status.  Prior to Admission medications   Medication Sig Start Date End Date Taking? Authorizing Provider  acetaminophen (TYLENOL) 500 MG tablet Take 1,000 mg by mouth every 6 (six) hours as needed (headache).   Yes Historical Provider, MD  albuterol (PROVENTIL HFA;VENTOLIN HFA) 108 (90 BASE) MCG/ACT inhaler Inhale into the lungs every 6 (six) hours as needed for wheezing or shortness of breath.   Yes Historical Provider, MD  albuterol (PROVENTIL) (2.5 MG/3ML) 0.083% nebulizer solution Take 2.5 mg by nebulization every 6 (six) hours as needed for wheezing (SOB).   Yes Historical Provider, MD  budesonide-formoterol (SYMBICORT) 160-4.5 MCG/ACT inhaler Inhale 2 puffs into the lungs 2 (two) times daily.   Yes  Historical Provider, MD  esomeprazole (NEXIUM) 40 MG capsule Take 40 mg by mouth daily as needed (heartburn).    Yes Historical Provider, MD  losartan-hydrochlorothiazide (HYZAAR) 50-12.5 MG tablet Take 1 tablet by mouth daily. 08/27/16  Yes Historical Provider, MD  tamsulosin (FLOMAX) 0.4 MG CAPS Take 0.8 mg by mouth daily.    Yes  Historical Provider, MD  Calcium Carbonate-Vitamin D (CALCIUM + D PO) Take 1 tablet by mouth daily. Calcium 600 +D   800UI    Historical Provider, MD    Physical Exam: Vitals:   10/17/16 1800 10/17/16 1900 10/17/16 1950 10/17/16 2057  BP: (!) 143/81 (!) 144/87 (!) 144/87 127/68  Pulse: (!) 105  (!) 105 (!) 108  Resp: 15 (!) 29 (!) 22 18  Temp:   99.5 F (37.5 C)   TempSrc:   Oral   SpO2: 97%  95% 97%  Weight:      Height:       General: Not in acute distress HEENT:       Eyes: PERRL, EOMI, no scleral icterus.       ENT: No discharge from the ears and nose, no pharynx injection, no tonsillar enlargement.        Neck: No JVD, no bruit, no mass felt. Heme: No neck lymph node enlargement. Cardiac: S1/S2, RRR, No murmurs, No gallops or rubs. Respiratory: Severely decreased air movement bilaterally. No rales, wheezing, rhonchi or rubs. GI: Soft, nondistended, nontender, no organomegaly, BS present. GU: No hematuria Ext: No pitting leg edema bilaterally. 2+DP/PT pulse bilaterally. Musculoskeletal: No joint deformities, No joint redness or warmth, no limitation of ROM in spin. Skin: No rashes.  Neuro: not oriented X3, cranial nerves II-XII grossly intact, moves all extremities.  Psych: Patient is not psychotic.  Labs on Admission: I have personally reviewed following labs and imaging studies  CBC:  Recent Labs Lab 10/17/16 1647  WBC 11.6*  NEUTROABS 10.0*  HGB 12.0*  HCT 36.3*  MCV 87.3  PLT 270   Basic Metabolic Panel:  Recent Labs Lab 10/17/16 1647  NA 135  K 4.1  CL 99*  CO2 23  GLUCOSE 133*  BUN 23*  CREATININE 1.29*  CALCIUM 8.9   GFR: Estimated Creatinine Clearance: 36.6 mL/min (A) (by C-G formula based on SCr of 1.29 mg/dL (H)). Liver Function Tests:  Recent Labs Lab 10/17/16 1647  AST 17  ALT 14*  ALKPHOS 51  BILITOT 0.8  PROT 7.8  ALBUMIN 4.1    Recent Labs Lab 10/17/16 1647  LIPASE 24   No results for input(s): AMMONIA in the last 168  hours. Coagulation Profile: No results for input(s): INR, PROTIME in the last 168 hours. Cardiac Enzymes: No results for input(s): CKTOTAL, CKMB, CKMBINDEX, TROPONINI in the last 168 hours. BNP (last 3 results) No results for input(s): PROBNP in the last 8760 hours. HbA1C: No results for input(s): HGBA1C in the last 72 hours. CBG: No results for input(s): GLUCAP in the last 168 hours. Lipid Profile: No results for input(s): CHOL, HDL, LDLCALC, TRIG, CHOLHDL, LDLDIRECT in the last 72 hours. Thyroid Function Tests: No results for input(s): TSH, T4TOTAL, FREET4, T3FREE, THYROIDAB in the last 72 hours. Anemia Panel: No results for input(s): VITAMINB12, FOLATE, FERRITIN, TIBC, IRON, RETICCTPCT in the last 72 hours. Urine analysis:    Component Value Date/Time   COLORURINE ORANGE (A) 08/29/2013 0010   APPEARANCEUR CLOUDY (A) 08/29/2013 0010   LABSPEC 1.015 08/29/2013 0010   PHURINE 5.5 08/29/2013 0010   GLUCOSEU NEGATIVE  08/29/2013 0010   HGBUR LARGE (A) 08/29/2013 0010   BILIRUBINUR NEGATIVE 08/29/2013 0010   KETONESUR NEGATIVE 08/29/2013 0010   PROTEINUR NEGATIVE 08/29/2013 0010   UROBILINOGEN 1.0 08/29/2013 0010   NITRITE POSITIVE (A) 08/29/2013 0010   LEUKOCYTESUR SMALL (A) 08/29/2013 0010   Sepsis Labs: @LABRCNTIP (procalcitonin:4,lacticidven:4) )No results found for this or any previous visit (from the past 240 hour(s)).   Radiological Exams on Admission: Dg Chest 2 View  Result Date: 10/17/2016 CLINICAL DATA:  Altered mental status, fever, history of prostate cancer EXAM: CHEST  2 VIEW COMPARISON:  09/13/2016 FINDINGS: Cardiomediastinal silhouette is stable. No infiltrate or pleural effusion. No pulmonary edema. Bony thorax is unremarkable. IMPRESSION: No active cardiopulmonary disease. Electronically Signed   By: Lahoma Crocker M.D.   On: 10/17/2016 16:30   Ct Head Wo Contrast  Result Date: 10/17/2016 CLINICAL DATA:  Lethargy.  Confusion.  Altered mental status. EXAM: CT HEAD  WITHOUT CONTRAST TECHNIQUE: Contiguous axial images were obtained from the base of the skull through the vertex without intravenous contrast. COMPARISON:  None. FINDINGS: Brain: There is an acute on subacute subdural hematoma overlying the left cerebral hemisphere. This measures 1.5 cm in maximum thickness and has mass effect upon the left frontal lobe. There is effacement of the underlying frontal lobe sulci. No midline shift noted. The ventricular volumes appear normal. A chronic appearing subdural hematoma overlying the right frontal lobe measures 7 mm in thickness, image 11 of series 2. No evidence for acute brain infarct. Vascular: No hyperdense vessel or unexpected calcification. Skull: Normal. Negative for fracture or focal lesion. Sinuses/Orbits: The paranasal sinuses and mastoid air cells are clear. The calvarium is intact. Other: None. IMPRESSION: 1. Acute on subacute left subdural hematoma is identified which exerts mass effect upon the underlying frontal lobe. 2. Chronic right frontal lobe subdural hematoma. Critical Value/emergent results were called by telephone at the time of interpretation on 10/17/2016 at 4:47 pm to Dr. Margarita Mail , who verbally acknowledged these results. Electronically Signed   By: Kerby Moors M.D.   On: 10/17/2016 16:48   Ct Abdomen Pelvis W Contrast  Result Date: 10/17/2016 CLINICAL DATA:  Acute onset of failure to thrive. Twelve left inguinal swelling. Stage IV bladder cancer and prostate cancer. Initial encounter. EXAM: CT ABDOMEN AND PELVIS WITH CONTRAST TECHNIQUE: Multidetector CT imaging of the abdomen and pelvis was performed using the standard protocol following bolus administration of intravenous contrast. CONTRAST:  15mL ISOVUE-300 IOPAMIDOL (ISOVUE-300) INJECTION 61% COMPARISON:  PET/CT performed 09/13/2016 FINDINGS: Lower chest: Trace pericardial fluid remains within normal limits. The visualized lung bases are clear. Hepatobiliary: Scattered small  hypodensities are noted within the liver, measuring up to 9 mm in size. The gallbladder is grossly unremarkable in appearance. The common bile duct remains normal in caliber. Pancreas: The pancreas is within normal limits. Spleen: The spleen is unremarkable in appearance. Adrenals/Urinary Tract: The adrenal glands are unremarkable in appearance. Scattered bilateral renal cysts are seen. There is no evidence of hydronephrosis. No renal or ureteral stones are identified. No perinephric stranding is seen. Stomach/Bowel: The stomach is unremarkable in appearance. The small bowel is within normal limits. The appendix is normal in caliber, without evidence of appendicitis. Diffuse diverticulosis noted along the distal transverse, descending and sigmoid colon, without evidence of diverticulitis. The small bowel is grossly unremarkable in appearance. The stomach is within normal limits, with underlying postoperative change. Vascular/Lymphatic: Diffuse calcification is seen along the abdominal aorta and its branches. The abdominal aorta is otherwise grossly unremarkable.  The inferior vena cava is grossly unremarkable. Enlarged retroperitoneal nodes measure up to 1.7 cm in short axis. An enlarged left pelvic sidewall node measures 1.9 cm in short axis. A large multilobulated left inguinal mass is seen, measuring approximately 7.7 x 4.4 x 3.4 cm. Reproductive: The bladder is mildly distended and grossly unremarkable. Scattered brachytherapy seeds are seen about the prostate bed. Other: No additional soft tissue abnormalities are seen. Musculoskeletal: No acute osseous abnormalities are identified. Endplate sclerotic change is noted at L2-L3. The visualized musculature is unremarkable in appearance. IMPRESSION: 1. Large multilobulated left inguinal mass noted, measuring 7.7 x 4.4 x 3.4 cm, reflecting metastatic disease. 2. Enlarged retroperitoneal nodes measure up to 1.7 cm in short axis. Enlarged left pelvic sidewall node  measures 1.9 cm in short axis. 3. Diffuse diverticulosis along the distal transverse, descending and sigmoid colon, without evidence of diverticulitis. 4. Diffuse aortic atherosclerosis. 5. Scattered small nonspecific hypodensities within the liver, measuring up to 9 mm in size. 6. Bilateral renal cysts seen. Electronically Signed   By: Garald Balding M.D.   On: 10/17/2016 19:13     EKG: Independently reviewed.  Sinus rhythm, QTC 446, tachycardia, low voltage   Assessment/Plan Principal Problem:   SDH (subdural hematoma) (HCC) Active Problems:   COPD (chronic obstructive pulmonary disease) (HCC)   Prostate cancer (HCC)   Urothelial cancer (HCC)   Acute encephalopathy   Sepsis (HCC)   HTN (hypertension)   CKD (chronic kidney disease), stage III   SDH (subdural hematoma) ([place]): CT-head showed acute on subacute left subdural hematoma is identified which exerts mass effect upon the underlying frontal lobe and also showed chronic right frontal lobe subdural hematoma. EDP consulted neurosurgeon, Dr. Vertell Limber who did not think pt is a good surgical candidate at this time.  -will admit to tele bed as inpt -consult palliative care -Frequent neuro check  -sitter bed side and fall precaution -hold all oral meds  Sepsis: Patient admitted criteria for sepsis with leukocytosis, fever, tachycardia and tachypnea. Lactate is normal. Hemodynamically stable. The source of infection is not clear. Chest x-rays negative for acute abnormalities. Pending urinalysis. Pt's wife wants pt be treated with antibiotics.  -IV vancomycin and Zosyn per pharmacy -Follow up blood culture, urine culture, urinalysis -will get Procalcitonin and trend lactic acid levels per sepsis protocol. -IVF: 1.5L of NS bolus in ED, followed by 100 cc/h   Acute encephalopathy: Likely due to combination of sepsis and SDH -Check sepsis as above -Frequent neuro checks  COPD (chronic obstructive pulmonary disease): Patient does not have  worsening shortness rest or productive cough, but has severely decreased air movement bilaterally. -scheduled atrovent nebs and prn xopenex  HTN: -IV hydralazine when necessary -We'll hold oral medications  GERD: -Pepcid IV  CKD-III: Stable. Baseline creatinine 1.2-1.5. His cre is 1.29. -Follow up renal function therapy map  End stage prostate cancer and Urothelial cancer: pt was followed up by Dr. Alen Blew. Last seen was on 3/271/8. Per Dr. Hazeline Junker note, pt continues to refuse systemic therapy or any definitive therapy. -consult to palliative care    DVT ppx: SCD Code Status: DNR (Dr. Gilford Raid and PA, Margarita Mail had a long conversation with his wife who feels that patient would not want to be resuscitated or intubated. Therefore, he is DNR/DNI.  Pt refused treatment for his end-stage prostate cancer before. I also confirmed with patient's wife that the patient would want to be DO NOT RESUSCITATE. I also explained clearly to his wife about the meaning of  DNR.  Family Communication: Yes, patient's wife at bed side Disposition Plan:  Anticipate discharge back to previous home environment Consults called:  Neurosurgery, Dr. Vertell Limber Admission status: Inpatient/tele      Date of Service 10/17/2016    Ivor Costa [facility] Pager [phone]  If 7PM-7AM, please contact night-coverage [email or web address] Password [facility] 10/17/2016, 9:26 PM

## 2016-10-17 NOTE — ED Notes (Signed)
End stage prostate cancer pt who has been generally declining for the past few days.  Today he stopped verbally responding, he has some pursed lip breathing.  Pt is not on hospice and has no advanced directives.

## 2016-10-17 NOTE — ED Notes (Signed)
Pt became agitated and family called out with concern.  Pt found to have wet the bed.  Gave him a urinal.  He voided some more, changed him out of wet clothing and changed him into gown and new sheets.  Pt calm after this

## 2016-10-17 NOTE — ED Notes (Signed)
Will give bedside report for room 1443.

## 2016-10-17 NOTE — ED Notes (Signed)
Attempted to verify that the 4th floor, primary nurse [phone] was ready for patient and had a sitter ready. 4th floor will have to call back when ready.

## 2016-10-17 NOTE — ED Provider Notes (Signed)
[facility] DEPT Provider Note   CSN: [id number] Arrival date & time: 10/17/16  1543     History   Chief Complaint Chief Complaint  Patient presents with  . Altered Mental Status  level V caveat due to altered mental status  HPI Kache Mcclurg is a 83 y.o. male.Brought in by his wife for altered mental status. He has a past medical history of COPD, prostate cancer with invasive urethral and urothelial cancer. He has up to this point refused all treatment. His wife states that he has been declining for the past several days with increased weakness and refusal to eat.  The patient seemed more confused and was less talkative yesterday. The patient's wife states that today he went to the restroom and she saw that he had "messed all over the place" which she states is very unlike him. She states that he came down to eat, but would not talk to her. He seemed to be more weak than usual but did not have any gait abnormalities, facial droop. She states that . He seemed to be staring off in space and not making I talked contact. She said, "Honey asked me Arbie Cookey told to recognize me?" And the patient just shook his head no. She states that he has not responded to her verbally sent that time. She called the ambulance for evaluation of the patient. EMS reports that upon arrival, the patient was tachycardic into the 130s. He does not appear febrile. EMS also reports that his abdomen appears distended.  HPI  Past Medical History:  Diagnosis Date  . COPD (chronic obstructive pulmonary disease) ([place])   . Hypertension   . Prostate cancer [facility])     Patient Active Problem List   Diagnosis Date Noted  . Goals of care, counseling/discussion 08/23/2016  . Urothelial cancer ([place]) 08/23/2016  . Urinary retention 08/29/2013  . UTI (lower urinary tract infection) 08/29/2013  . Dehydration 08/29/2013  . Acute renal failure ([place]) 08/29/2013  . COPD with acute exacerbation ([place]) 08/29/2013  . COPD  exacerbation ([place]) 08/29/2013  . Abdominal distention 08/29/2013  . Diarrhea 08/29/2013  . COPD (chronic obstructive pulmonary disease) ([place])   . Prostate cancer [facility])     Past Surgical History:  Procedure Laterality Date  . gastric ulcer repair    . HERNIA REPAIR         Home Medications    Prior to Admission medications   Medication Sig Start Date End Date Taking? Authorizing Provider  albuterol (PROVENTIL HFA;VENTOLIN HFA) 108 (90 BASE) MCG/ACT inhaler Inhale into the lungs every 6 (six) hours as needed for wheezing or shortness of breath.    Historical Provider, MD  albuterol (PROVENTIL) (2.5 MG/3ML) 0.083% nebulizer solution Take 2.5 mg by nebulization every 6 (six) hours as needed for wheezing (SOB).    Historical Provider, MD  budesonide-formoterol (SYMBICORT) 160-4.5 MCG/ACT inhaler Inhale 2 puffs into the lungs 2 (two) times daily.    Historical Provider, MD  Calcium Carbonate-Vitamin D (CALCIUM + D PO) Take 1 tablet by mouth daily. Calcium 600 +D   800UI    Historical Provider, MD  esomeprazole (NEXIUM) 40 MG capsule Take 40 mg by mouth daily at 12 noon.    Historical Provider, MD  levofloxacin (LEVAQUIN) 500 MG tablet Take 1 tablet (500 mg total) by mouth daily. 08/30/13   Venetia Maxon Rama, MD  lisinopril (PRINIVIL,ZESTRIL) 10 MG tablet Take 10 mg by mouth daily.    Historical Provider, MD  predniSONE (DELTASONE) 10 MG tablet  Take 3 tablets daily for 2 days, then 2 tablets daily for 2 days, then 1 tablet daily for 2 days then stop. 08/30/13   Venetia Maxon Rama, MD  ranitidine (ZANTAC) 75 MG tablet Take 75 mg by mouth 2 (two) times daily.    Historical Provider, MD  tamsulosin (FLOMAX) 0.4 MG CAPS Take 0.8 mg by mouth.    Historical Provider, MD    Family History No family history on file.  Social History Social History  Substance Use Topics  . Smoking status: Former Smoker    Types: Cigarettes    Quit date: 08/09/2011  . Smokeless tobacco: Never Used  . Alcohol use No       Allergies   Demerol [meperidine] and Promethazine   Review of Systems Review of Systems Ten systems reviewed and are negative for acute change, except as noted in the HPI.    Physical Exam Updated Vital Signs There were no vitals taken for this visit.  Physical Exam  Constitutional: He appears well-developed and well-nourished. No distress.  Thin, cachectic male with appearance of chronic emphysema.  HENT:  Head: Normocephalic and atraumatic.  Eyes: Conjunctivae and EOM are normal. Pupils are equal, round, and reactive to light. No scleral icterus.  Neck: Normal range of motion. Neck supple.  Cardiovascular: Normal rate, regular rhythm and normal heart sounds.   Pulmonary/Chest: Effort normal. No respiratory distress. He has wheezes.  Abdominal: Soft. He exhibits distension. There is no tenderness. There is no guarding.  Musculoskeletal: He exhibits no edema.  Lymphadenopathy:       Right: Inguinal adenopathy present.       Left: Inguinal adenopathy present.  Neurological: He is alert. GCS eye subscore is 4. GCS verbal subscore is 1. GCS motor subscore is 5.  Skin: Skin is warm and dry. He is not diaphoretic.  Psychiatric: His behavior is normal.  Nursing note and vitals reviewed.    ED Treatments / Results  Labs (all labs ordered are listed, but only abnormal results are displayed) Labs Reviewed - No data to display  EKG  EKG Interpretation None       Radiology No results found.  Procedures Procedures (including critical care time)  Medications Ordered in ED Medications - No data to display   Initial Impression / Assessment and Plan / ED Course  I have reviewed the triage vital signs and the nursing notes.  Pertinent labs & imaging results that were available during my care of the patient were reviewed by me and considered in my medical decision making (see chart for details).  Clinical Course as of Oct 17 1657  Tue Oct 17, 2016  1646 Patient  with Acute on chronic hematoma with mass effect. I was notified by Dr. Clovis Riley.  [AH]    Clinical Course User Index [AH] Margarita Mail, PA-C    8:13 PM Patient here with altered mental status. He appears to meet sepsis criteria, has been hemodynamically stable and treated with pink and Zosyn. The patient has a large acute on chronic subdural hematoma affecting his ability to speak. I did spoke to speak with Dr. Vertell Limber, and as the patient has previously refused all treatment for his metastatic cancer. The patient does not feel he is a good surgical candidate at this time. Dr. Gilford Raid and I had a long conversation with his wife who feels that she is ready for a hospitalist consultation and is also stating that the patient would not want to be resuscitated or intubated. Therefore, he  is DNR/DNI. His abdominal scan does show significant metastatic disease.  Final Clinical Impressions(s) / ED Diagnoses   Final diagnoses:  None    New Prescriptions New Prescriptions   No medications on file     Margarita Mail, PA-C 10/18/16 1227    Isla Pence, MD 10/19/16 [phone]

## 2016-10-17 NOTE — Progress Notes (Signed)
Pharmacy Antibiotic Note  Larry Stanley is a 83 y.o. male admitted on 10/17/2016 with sepsis.  Pharmacy has been consulted for vancomycin and zosyn dosing. Wt 56.7, lactate 1.35, temp 100.8. WBC 11.6, creat 1.29. CXR neg, head CT: Acute on subacute left subdural hematoma is identified which exerts mass effect upon the underlying frontal lobe. 2. Chronic right frontal lobe subdural hematoma. Pt with end stage prostate cancer  Plan: vancomycin 1 gm loading dose followed by vancomycin 750 mg IV q24 for vancomycin trough goal 15 - 20 mcg/ml for sepsis Zosyn 3.375 gm IV x 1 dose over 30 minutes Zosyn 3.375g IV q8h (4 hour infusion).  f/u renal fx, WBC, temp, cult vure data,  Vancomycin levels as needed  Height: 5\' 6"  (167.6 cm) Weight: 125 lb (56.7 kg) IBW/kg (Calculated) : 63.8  Temp (24hrs), Avg:100 F (37.8 C), Min:99.1 F (37.3 C), Max:100.8 F (38.2 C)   Recent Labs Lab 10/17/16 1703  LATICACIDVEN 1.35    CrCl cannot be calculated (Patient's most recent lab result is older than the maximum 21 days allowed.).    Allergies  Allergen Reactions  . Demerol [Meperidine] Diarrhea and Nausea And Vomiting  . Promethazine Swelling    Swelling of both legs    Antimicrobials this admission: vanc 4/17>> Zosyn 4/17>> Dose adjustments this admission:  Microbiology results:   Eudelia Bunch, Pharm.D. [phone] 10/17/2016 5:14 PM

## 2016-10-17 NOTE — ED Notes (Signed)
Pt had one liter NS from ems

## 2016-10-18 DIAGNOSIS — N183 Chronic kidney disease, stage 3 (moderate): Secondary | ICD-10-CM

## 2016-10-18 LAB — BASIC METABOLIC PANEL
Anion gap: 10 (ref 5–15)
BUN: 19 mg/dL (ref 6–20)
CALCIUM: 8.6 mg/dL — AB (ref 8.9–10.3)
CHLORIDE: 104 mmol/L (ref 101–111)
CO2: 23 mmol/L (ref 22–32)
CREATININE: 1.13 mg/dL (ref 0.61–1.24)
GFR calc non Af Amer: 59 mL/min — ABNORMAL LOW (ref >60)
Glucose, Bld: 102 mg/dL — ABNORMAL HIGH (ref 65–99)
Potassium: 3.8 mmol/L (ref 3.5–5.1)
SODIUM: 137 mmol/L (ref 135–145)

## 2016-10-18 LAB — CBC
HCT: 34.1 % — ABNORMAL LOW (ref 39.0–52.0)
Hemoglobin: 11.4 g/dL — ABNORMAL LOW (ref 13.0–17.0)
MCH: 29.6 pg (ref 26.0–34.0)
MCHC: 33.4 g/dL (ref 30.0–36.0)
MCV: 88.6 fL (ref 78.0–100.0)
PLATELETS: 329 10*3/uL (ref 150–400)
RBC: 3.85 MIL/uL — AB (ref 4.22–5.81)
RDW: 12.7 % (ref 11.5–15.5)
WBC: 12.4 10*3/uL — AB (ref 4.0–10.5)

## 2016-10-18 LAB — GLUCOSE, CAPILLARY: GLUCOSE-CAPILLARY: 93 mg/dL (ref 65–99)

## 2016-10-18 MED ORDER — LORAZEPAM 2 MG/ML IJ SOLN
0.5000 mg | INTRAMUSCULAR | Status: DC | PRN
  Administered 2016-10-18: 0.5 mg via INTRAVENOUS
  Filled 2016-10-18: qty 1

## 2016-10-18 MED ORDER — VANCOMYCIN HCL 500 MG IV SOLR
500.0000 mg | Freq: Two times a day (BID) | INTRAVENOUS | Status: DC
  Administered 2016-10-18 – 2016-10-19 (×2): 500 mg via INTRAVENOUS
  Filled 2016-10-18 (×3): qty 500

## 2016-10-18 MED ORDER — MORPHINE SULFATE (PF) 4 MG/ML IV SOLN
1.0000 mg | INTRAVENOUS | Status: DC | PRN
  Administered 2016-10-18 – 2016-10-21 (×10): 1 mg via INTRAVENOUS
  Filled 2016-10-18 (×11): qty 1

## 2016-10-18 MED ORDER — PIPERACILLIN-TAZOBACTAM 3.375 G IVPB
3.3750 g | Freq: Three times a day (TID) | INTRAVENOUS | Status: DC
  Administered 2016-10-18 – 2016-10-19 (×4): 3.375 g via INTRAVENOUS
  Filled 2016-10-18 (×6): qty 50

## 2016-10-18 MED ORDER — LORAZEPAM 2 MG/ML IJ SOLN: 0.5000 mg | Freq: Four times a day (QID) | INTRAMUSCULAR | Status: DC | PRN

## 2016-10-18 MED ORDER — DIPHENHYDRAMINE HCL 50 MG/ML IJ SOLN
25.0000 mg | Freq: Once | INTRAMUSCULAR | Status: AC
  Administered 2016-10-18: 25 mg via INTRAVENOUS
  Filled 2016-10-18: qty 1

## 2016-10-18 NOTE — Consult Note (Signed)
Consultation Note Date: 10/18/2016   Patient Name: Larry Stanley  DOB: Dec 18, 1933  MRN: [id number]  Age / Sex: 83 y.o., male  PCP: Nolene Ebbs, MD Referring Physician: Annita Brod, MD  Reason for Consultation: Establishing goals of care  HPI/Patient Profile: 83 y.o. male    admitted on 10/17/2016    Clinical Assessment and Goals of Care:  83 y.o. male with medical history significant of COPD, end stage of prostate cancer with invasive urethral and urothelial cancer (s/p of placement of radiation seeds), hypertension, GERD, CKD-III, who presented overnight with AMS.  Patient's wife started noticing that he has been declining for the past several days with increased weakness, confusion, not able to talk andrefusal to eat. The patient is more confused and was less talkative since yesterday, and stopped talking today. Patient was found to have an acute on chronic sub acute L SDH, mass effect on frontal lobe, in addition to a  CT abdomen/pelvis showed:1. Large multilobulated left inguinal mass noted, measuring 7.7 x 4.4 x 3.4 cm, reflecting metastatic disease.2. Enlarged retroperitoneal nodes measure up to 1.7 cm in short axis. Enlarged left pelvic sidewall node measures 1.9 cm in short axis. 3. Diffuse diverticulosis along the distal transverse, descending and sigmoid colon, without evidence of diverticulitis.  A palliative consult has been placed for goals of care discussions and for symptom management.   Patient seen, met with wife along with Dr Maryland Pink from [facility] and the nursing staff on 4W. Appreciate Dr Lyman Speller input and recommendations, I introduced myself and palliative care as follows: Palliative medicine is specialized medical care for people living with serious illness. It focuses on providing relief from the symptoms and stress of a serious illness. The goal is to improve quality of life for  both the patient and the family.  Patient's wife is tearful, she states she is well aware of the patient's condition and his prognosis likely being short. We discussed about the dying process, signs and symptoms in detail. She wishes for the patient to be kept as comfortable as possible. All questions answered to the best of my ability, offered active listening and supportive care for wife, encouraged her to take regular breaks, eat on time, take her medications etc.     NEXT OF KIN  wife, second marriage for both of them, they have been together for 40+ years. Wife has one child, patient has 2 children. They live locally.   SUMMARY OF RECOMMENDATIONS    aggressive symptom management Focus on comfort measures Anticipated hospital death Discussed with wife about comfort measures and hospice philosophy of care Wife does not want any one else to receive updates/medical information except her Wife will request chaplain visit when needed Comfort cart for family and wife  Code Status/Advance Care Planning:  DNR    Symptom Management:    as above   Palliative Prophylaxis:   Delirium Protocol  Additional Recommendations (Limitations, Scope, Preferences):  Full Comfort Care  Psycho-social/Spiritual:   Desire for further Chaplaincy support:no  Additional Recommendations: Education  on Hospice  Prognosis:   Hours - Days  Discharge Planning: Anticipated Hospital Death      Primary Diagnoses: Present on Admission: . SDH (subdural hematoma) ([place]) . COPD (chronic obstructive pulmonary disease) ([place]) . Prostate cancer ([place]) . Urothelial cancer ([place]) . Acute encephalopathy . Sepsis ([place]) . HTN (hypertension) . CKD (chronic kidney disease), stage III   I have reviewed the medical record, interviewed the patient and family, and examined the patient. The following aspects are pertinent.  Past Medical History:  Diagnosis Date  . COPD (chronic obstructive pulmonary disease)  ([place])   . Hypertension   . Prostate cancer [facility])    Social History   Social History  . Marital status: Married    Spouse name: N/A  . Number of children: N/A  . Years of education: N/A   Social History Main Topics  . Smoking status: Former Smoker    Types: Cigarettes    Quit date: 08/09/2011  . Smokeless tobacco: Never Used  . Alcohol use No  . Drug use: No  . Sexual activity: Not Asked   Other Topics Concern  . None   Social History Narrative  . None   No family history on file. Scheduled Meds: . ipratropium  0.5 mg Nebulization TID  . levalbuterol  1.25 mg Nebulization TID  . sodium chloride flush  3 mL Intravenous Q12H   Continuous Infusions: . sodium chloride 100 mL/hr at 10/18/16 0325  . famotidine (PEPCID) IV 20 mg (10/18/16 1016)  . piperacillin-tazobactam (ZOSYN)  IV 3.375 g (10/18/16 1016)  . vancomycin     PRN Meds:.acetaminophen **OR** acetaminophen, hydrALAZINE, LORazepam, ondansetron Medications Prior to Admission:  Prior to Admission medications   Medication Sig Start Date End Date Taking? Authorizing Provider  acetaminophen (TYLENOL) 500 MG tablet Take 1,000 mg by mouth every 6 (six) hours as needed (headache).   Yes Historical Provider, MD  albuterol (PROVENTIL HFA;VENTOLIN HFA) 108 (90 BASE) MCG/ACT inhaler Inhale into the lungs every 6 (six) hours as needed for wheezing or shortness of breath.   Yes Historical Provider, MD  albuterol (PROVENTIL) (2.5 MG/3ML) 0.083% nebulizer solution Take 2.5 mg by nebulization every 6 (six) hours as needed for wheezing (SOB).   Yes Historical Provider, MD  budesonide-formoterol (SYMBICORT) 160-4.5 MCG/ACT inhaler Inhale 2 puffs into the lungs 2 (two) times daily.   Yes Historical Provider, MD  esomeprazole (NEXIUM) 40 MG capsule Take 40 mg by mouth daily as needed (heartburn).    Yes Historical Provider, MD  losartan-hydrochlorothiazide (HYZAAR) 50-12.5 MG tablet Take 1 tablet by mouth daily. 08/27/16  Yes Historical  Provider, MD  tamsulosin (FLOMAX) 0.4 MG CAPS Take 0.8 mg by mouth daily.    Yes Historical Provider, MD  Calcium Carbonate-Vitamin D (CALCIUM + D PO) Take 1 tablet by mouth daily. Calcium 600 +D   800UI    Historical Provider, MD   Allergies  Allergen Reactions  . Demerol [Meperidine] Diarrhea and Nausea And Vomiting  . Promethazine Swelling    Swelling of both legs   Review of Systems + for restlessness, weakness  Physical Exam Restless elderly gentleman Attempting to get out of bed Diminished breath sounds Unable to speak S1 S2 Abdomen soft Thin muscle wasting No edema Awake not alert  Vital Signs: BP (!) 146/73 (BP Location: Left Arm)   Pulse 90   Temp 100.3 F (37.9 C) (Oral)   Resp 16   Ht _0  (1.676 m)   Wt 57.4 kg (126 lb 8.7 oz)  SpO2 96%   BMI 20.42 kg/m  Pain Assessment: PAINAD   Pain Score: 0-No pain   SpO2: SpO2: 96 % O2 Device:SpO2: 96 % O2 Flow Rate: .   IO: Intake/output summary:  Intake/Output Summary (Last 24 hours) at 10/18/16 1129 Last data filed at 10/18/16 0600  Gross per 24 hour  Intake           901.67 ml  Output              150 ml  Net           751.67 ml    LBM: Last BM Date: 10/17/16 Baseline Weight: Weight: 56.7 kg (125 lb) Most recent weight: Weight: 57.4 kg (126 lb 8.7 oz)     Palliative Assessment/Data:   Flowsheet Rows     Most Recent Value  Intake Tab  Referral Department  Hospitalist  Unit at Time of Referral  Oncology Unit  Palliative Care Primary Diagnosis  Cancer  Palliative Care Type  New Palliative care  Reason for referral  End of Life Care Assistance  Clinical Assessment  Palliative Performance Scale Score  20%  Pain Max last 24 hours  5  Pain Min Last 24 hours  4  Dyspnea Max Last 24 Hours  4  Dyspnea Min Last 24 hours  3  Nausea Max Last 24 Hours  4  Nausea Min Last 24 Hours  3  Psychosocial & Spiritual Assessment  Palliative Care Outcomes  Patient/Family meeting held?  Yes  Who was at the  meeting?  patient, wife   Palliative Care Outcomes  Clarified goals of care      Time In:  10 Time Out:  11.10 Time Total:   70 min  Greater than 50%  of this time was spent counseling and coordinating care related to the above assessment and plan.  Signed by: Loistine Chance, MD  [phone]  Please contact Palliative Medicine Team phone at [phone] for questions and concerns.  For individual provider: See Shea Evans

## 2016-10-18 NOTE — Progress Notes (Signed)
PROGRESS NOTE  Larry Stanley RJJ:[id number] DOB: Jul 15, 1933 DOA: 10/17/2016 PCP: Philis Fendt, MD  HPI/Recap of past 67 hours: 83 year old male with past oral history of end-stage prostate cancer with invasive urethral/urothelial cancer plus COPD and stage III chronic kidney disease who wife had noted over the past few days, patient started having increased weakness and decline and refusing to eat. She brought him into the emergency room on 4/17 after he stopped talking. In emergency room, patient found have a white count of 11.6 with otherwise normal labs although CT abdomen and pelvis noted large multilobulated left inguinal mass consistent with metastatic disease and a CT scan had abdomen acute on subacute left subdural hematoma with some mass effect on underlying frontal lobe plus a chronic right frontal lobe subdural hematoma. Patient was admitted to the hospitalist service and treated for sepsis. Discussed case with neurosurgery who felt that given advanced complications, patient would not be appropriate candidate for surgical intervention. I've clarified that patient should be a DO NOT RESUSCITATE and transitioning toward comfort care. Palliative care consulted.  Assessment/Plan: Principal Problem:   SDH (subdural hematoma) [facility]): Patient now being transitioned toward comfort care. No intervention as per neurosurgery given medical issues and patient's state Active Problems:   COPD (chronic obstructive pulmonary disease) ([place]): Stable at this time   Prostate cancer ([place])   Urothelial cancer ([place])   Acute encephalopathy: Attempted IV Ativan when necessary which make patient more agitated. We'll try IV Benadryl. Does not appear to be in any pain. [facility]): Patient met criteria given fever, tachycardia and mild leukocytosis. Unknown source.   HTN (hypertension): Stable   CKD (chronic kidney disease), stage III   Code Status: DO NOT RESUSCITATE-transitioning to comfort care. Seen by  palliative care. Appreciate consult. Expect that likely he will pass within the next 1-2 days. Patient's wife is obviously upset and understanding so, although she says that she is mentally preparing for this time. She asked that he focus on making him comfortable.    Family Communication: Wife at the bedside   Disposition Plan: Patient be made comfortable. Anticipate he will likely pass within the next 1-2 days    Consultants:  Hospice/palliative care  Case discussed with neurosurgery   Procedures:  None   Antimicrobials:  IV Zosyn and vancomycin 4/17-present   DVT prophylaxis: SCDs   Objective: Vitals:   10/17/16 2229 10/17/16 2253 10/18/16 0535 10/18/16 0916  BP:  138/77 (!) 146/73   Pulse:  91 90   Resp:  16 16   Temp:  99.4 F (37.4 C) 100.3 F (37.9 C)   TempSrc:  Oral Oral   SpO2: 98% 100% 99% 96%  Weight:  57.4 kg (126 lb 8.7 oz)    Height:  '5\' 6"'$  (1.676 m)      Intake/Output Summary (Last 24 hours) at 10/18/16 1324 Last data filed at 10/18/16 0600  Gross per 24 hour  Intake           901.67 ml  Output              150 ml  Net           751.67 ml   Filed Weights   10/17/16 1608 10/17/16 2253  Weight: 56.7 kg (125 lb) 57.4 kg (126 lb 8.7 oz)    Exam:   General: Nonverbal  Cardiovascular: Regular rate and rhythm, S1-S2   Respiratory: Clear to auscultation bilaterally   Abdomen: Soft, appears nontender, nondistended, hypoactive bowel sounds  Musculoskeletal: No clubbing or cyanosis, trace pitting edema   Psychiatry: Episodes of agitation intermittently    Data Reviewed: CBC:  Recent Labs Lab 10/17/16 1647 10/18/16 0531  WBC 11.6* 12.4*  NEUTROABS 10.0*  --   HGB 12.0* 11.4*  HCT 36.3* 34.1*  MCV 87.3 88.6  PLT 349 161   Basic Metabolic Panel:  Recent Labs Lab 10/17/16 1647 10/18/16 0531  NA 135 137  K 4.1 3.8  CL 99* 104  CO2 23 23  GLUCOSE 133* 102*  BUN 23* 19  CREATININE 1.29* 1.13  CALCIUM 8.9 8.6*    GFR: Estimated Creatinine Clearance: 42.3 mL/min (by C-G formula based on SCr of 1.13 mg/dL). Liver Function Tests:  Recent Labs Lab 10/17/16 1647  AST 17  ALT 14*  ALKPHOS 51  BILITOT 0.8  PROT 7.8  ALBUMIN 4.1    Recent Labs Lab 10/17/16 1647  LIPASE 24   No results for input(s): AMMONIA in the last 168 hours. Coagulation Profile: No results for input(s): INR, PROTIME in the last 168 hours. Cardiac Enzymes: No results for input(s): CKTOTAL, CKMB, CKMBINDEX, TROPONINI in the last 168 hours. BNP (last 3 results) No results for input(s): PROBNP in the last 8760 hours. HbA1C: No results for input(s): HGBA1C in the last 72 hours. CBG:  Recent Labs Lab 10/18/16 0803  GLUCAP 93   Lipid Profile: No results for input(s): CHOL, HDL, LDLCALC, TRIG, CHOLHDL, LDLDIRECT in the last 72 hours. Thyroid Function Tests: No results for input(s): TSH, T4TOTAL, FREET4, T3FREE, THYROIDAB in the last 72 hours. Anemia Panel: No results for input(s): VITAMINB12, FOLATE, FERRITIN, TIBC, IRON, RETICCTPCT in the last 72 hours. Urine analysis:    Component Value Date/Time   COLORURINE YELLOW 10/17/2016 1910   APPEARANCEUR CLEAR 10/17/2016 1910   LABSPEC >1.046 (H) 10/17/2016 1910   PHURINE 5.0 10/17/2016 1910   GLUCOSEU NEGATIVE 10/17/2016 1910   HGBUR MODERATE (A) 10/17/2016 1910   BILIRUBINUR NEGATIVE 10/17/2016 1910   KETONESUR 5 (A) 10/17/2016 1910   PROTEINUR NEGATIVE 10/17/2016 1910   UROBILINOGEN 1.0 08/29/2013 0010   NITRITE NEGATIVE 10/17/2016 1910   LEUKOCYTESUR NEGATIVE 10/17/2016 1910   Sepsis Labs: '@LABRCNTIP'$ (procalcitonin:4,lacticidven:4)  )No results found for this or any previous visit (from the past 240 hour(s)).    Studies: Dg Chest 2 View  Result Date: 10/17/2016 CLINICAL DATA:  Altered mental status, fever, history of prostate cancer EXAM: CHEST  2 VIEW COMPARISON:  09/13/2016 FINDINGS: Cardiomediastinal silhouette is stable. No infiltrate or pleural  effusion. No pulmonary edema. Bony thorax is unremarkable. IMPRESSION: No active cardiopulmonary disease. Electronically Signed   By: Lahoma Crocker M.D.   On: 10/17/2016 16:30   Ct Head Wo Contrast  Result Date: 10/17/2016 CLINICAL DATA:  Lethargy.  Confusion.  Altered mental status. EXAM: CT HEAD WITHOUT CONTRAST TECHNIQUE: Contiguous axial images were obtained from the base of the skull through the vertex without intravenous contrast. COMPARISON:  None. FINDINGS: Brain: There is an acute on subacute subdural hematoma overlying the left cerebral hemisphere. This measures 1.5 cm in maximum thickness and has mass effect upon the left frontal lobe. There is effacement of the underlying frontal lobe sulci. No midline shift noted. The ventricular volumes appear normal. A chronic appearing subdural hematoma overlying the right frontal lobe measures 7 mm in thickness, image 11 of series 2. No evidence for acute brain infarct. Vascular: No hyperdense vessel or unexpected calcification. Skull: Normal. Negative for fracture or focal lesion. Sinuses/Orbits: The paranasal sinuses and mastoid air cells are  clear. The calvarium is intact. Other: None. IMPRESSION: 1. Acute on subacute left subdural hematoma is identified which exerts mass effect upon the underlying frontal lobe. 2. Chronic right frontal lobe subdural hematoma. Critical Value/emergent results were called by telephone at the time of interpretation on 10/17/2016 at 4:47 pm to Dr. Margarita Mail , who verbally acknowledged these results. Electronically Signed   By: Kerby Moors M.D.   On: 10/17/2016 16:48   Ct Abdomen Pelvis W Contrast  Result Date: 10/17/2016 CLINICAL DATA:  Acute onset of failure to thrive. Twelve left inguinal swelling. Stage IV bladder cancer and prostate cancer. Initial encounter. EXAM: CT ABDOMEN AND PELVIS WITH CONTRAST TECHNIQUE: Multidetector CT imaging of the abdomen and pelvis was performed using the standard protocol following bolus  administration of intravenous contrast. CONTRAST:  156m ISOVUE-300 IOPAMIDOL (ISOVUE-300) INJECTION 61% COMPARISON:  PET/CT performed 09/13/2016 FINDINGS: Lower chest: Trace pericardial fluid remains within normal limits. The visualized lung bases are clear. Hepatobiliary: Scattered small hypodensities are noted within the liver, measuring up to 9 mm in size. The gallbladder is grossly unremarkable in appearance. The common bile duct remains normal in caliber. Pancreas: The pancreas is within normal limits. Spleen: The spleen is unremarkable in appearance. Adrenals/Urinary Tract: The adrenal glands are unremarkable in appearance. Scattered bilateral renal cysts are seen. There is no evidence of hydronephrosis. No renal or ureteral stones are identified. No perinephric stranding is seen. Stomach/Bowel: The stomach is unremarkable in appearance. The small bowel is within normal limits. The appendix is normal in caliber, without evidence of appendicitis. Diffuse diverticulosis noted along the distal transverse, descending and sigmoid colon, without evidence of diverticulitis. The small bowel is grossly unremarkable in appearance. The stomach is within normal limits, with underlying postoperative change. Vascular/Lymphatic: Diffuse calcification is seen along the abdominal aorta and its branches. The abdominal aorta is otherwise grossly unremarkable. The inferior vena cava is grossly unremarkable. Enlarged retroperitoneal nodes measure up to 1.7 cm in short axis. An enlarged left pelvic sidewall node measures 1.9 cm in short axis. A large multilobulated left inguinal mass is seen, measuring approximately 7.7 x 4.4 x 3.4 cm. Reproductive: The bladder is mildly distended and grossly unremarkable. Scattered brachytherapy seeds are seen about the prostate bed. Other: No additional soft tissue abnormalities are seen. Musculoskeletal: No acute osseous abnormalities are identified. Endplate sclerotic change is noted at L2-L3.  The visualized musculature is unremarkable in appearance. IMPRESSION: 1. Large multilobulated left inguinal mass noted, measuring 7.7 x 4.4 x 3.4 cm, reflecting metastatic disease. 2. Enlarged retroperitoneal nodes measure up to 1.7 cm in short axis. Enlarged left pelvic sidewall node measures 1.9 cm in short axis. 3. Diffuse diverticulosis along the distal transverse, descending and sigmoid colon, without evidence of diverticulitis. 4. Diffuse aortic atherosclerosis. 5. Scattered small nonspecific hypodensities within the liver, measuring up to 9 mm in size. 6. Bilateral renal cysts seen. Electronically Signed   By: JGarald BaldingM.D.   On: 10/17/2016 19:13    Scheduled Meds: . diphenhydrAMINE  25 mg Intravenous Once  . ipratropium  0.5 mg Nebulization TID  . levalbuterol  1.25 mg Nebulization TID  . sodium chloride flush  3 mL Intravenous Q12H    Continuous Infusions: . sodium chloride 100 mL/hr at 10/18/16 0325  . famotidine (PEPCID) IV 20 mg (10/18/16 1016)  . piperacillin-tazobactam (ZOSYN)  IV 3.375 g (10/18/16 1016)  . vancomycin       LOS: 1 day     KAnnita Brod MD [facility] Pager [phone]  If 7PM-7AM, please contact night-coverage [email or web address] Password [email or web address] 10/18/2016, 1:24 PM

## 2016-10-18 NOTE — Progress Notes (Signed)
Nutrition Brief Note  83 year old male with past oral history of end-stage prostate cancer with invasive urethral/urothelial cancer plus COPD and stage III chronic kidney disease admitted for AMS and found to have SDH. Pt anticipated to have hospital death in 1-2 days.  Chart reviewed. Pt now transitioning to comfort care.  No further nutrition interventions warranted at this time.  Please re-consult as needed.   Koleen Distance, RD, LDN Pager #- [phone]

## 2016-10-18 NOTE — Progress Notes (Signed)
Pharmacy Antibiotic Note  Larry Stanley is a 83 y.o. male with end-stage prostate cancer admitted on 10/17/2016 with sepsis of unclear source.  Pharmacy has been consulted for vancomycin and zosyn dosing.   Today, 10/18/2016 Day #2 Vancomycin and Zosyn Tmax 100.3 WBC 12.4, increased SCr 1.13, decreased CrCl 42 ml/min Cultures pending  Plan:  Change vancomycin to 500mg  IV q12h for improved SCr (received 1g load 4/17 ~18:00)  Check trough at steady state  Continue Zosyn 3.375gm IV q8h (4hr extended infusions)  Follow SCr daily  Follow up renal function & cultures  Height: 5\' 6"  (167.6 cm) Weight: 126 lb 8.7 oz (57.4 kg) IBW/kg (Calculated) : 63.8  Temp (24hrs), Avg:99.8 F (37.7 C), Min:99.1 F (37.3 C), Max:100.8 F (38.2 C)   Recent Labs Lab 10/17/16 1647 10/17/16 1703 10/17/16 2136 10/18/16 0531  WBC 11.6*  --   --  12.4*  CREATININE 1.29*  --   --  1.13  LATICACIDVEN  --  1.35 1.1  --     Estimated Creatinine Clearance: 42.3 mL/min (by C-G formula based on SCr of 1.13 mg/dL).    Allergies  Allergen Reactions  . Demerol [Meperidine] Diarrhea and Nausea And Vomiting  . Promethazine Swelling    Swelling of both legs    Antimicrobials this admission:  4/17 Vanc >> 4/17 Zosyn >>  Dose adjustments this admission:  4/18: change vanc to 500mg  q12h for improved SCr  Microbiology results:  4/17 BCx: sent 4/17 UCx: sent   Peggyann Juba, PharmD, BCPS Pager: [phone] 10/18/2016 12:31 PM

## 2016-10-19 LAB — URINE CULTURE: Culture: NO GROWTH

## 2016-10-19 LAB — CREATININE, SERUM
Creatinine, Ser: 1.17 mg/dL (ref 0.61–1.24)
GFR calc Af Amer: >60 mL/min (ref >60)
GFR calc non Af Amer: 57 mL/min — ABNORMAL LOW (ref >60)

## 2016-10-19 LAB — GLUCOSE, CAPILLARY
GLUCOSE-CAPILLARY: 66 mg/dL (ref 65–99)
Glucose-Capillary: 77 mg/dL (ref 65–99)

## 2016-10-19 MED ORDER — GLYCOPYRROLATE 0.2 MG/ML IJ SOLN
0.4000 mg | INTRAMUSCULAR | Status: DC | PRN
  Administered 2016-10-20 – 2016-10-21 (×2): 0.4 mg via INTRAVENOUS
  Filled 2016-10-19 (×3): qty 2

## 2016-10-19 MED ORDER — LORAZEPAM 2 MG/ML IJ SOLN
2.0000 mg | INTRAMUSCULAR | Status: DC | PRN
  Filled 2016-10-19 (×3): qty 1

## 2016-10-19 MED ORDER — DEXAMETHASONE SODIUM PHOSPHATE 10 MG/ML IJ SOLN
10.0000 mg | Freq: Four times a day (QID) | INTRAMUSCULAR | Status: DC
  Administered 2016-10-19 – 2016-10-23 (×16): 10 mg via INTRAVENOUS
  Filled 2016-10-19 (×21): qty 1

## 2016-10-19 NOTE — Progress Notes (Signed)
Called into room by patient wife stating that patient was having facial and arm spasms.  These spasms lasted approximently 2 minutes.  Spasms stopped and patient was repositioned in the bed.  MD made aware.  Orders for IV decadron and PRN ativan if needed.  PT resting comfortably in the bed.  Will continue to monitor closely.

## 2016-10-19 NOTE — Progress Notes (Signed)
PROGRESS NOTE  Larry Stanley [id number]:[id number] DOB: [birth year] DOA: 10/17/2016 PCP: Philis Fendt, MD  HPI/Recap of past 4 hours: 83 year old male with past oral history of end-stage prostate cancer with invasive urethral/urothelial cancer plus COPD and stage III chronic kidney disease who wife had noted over the past few days, patient started having increased weakness and decline and refusing to eat. She brought him into the emergency room on 4/17 after he stopped talking. In emergency room, patient found have a white count of 11.6 with otherwise normal labs although CT abdomen and pelvis noted large multilobulated left inguinal mass consistent with metastatic disease and a CT scan of the head which noted acute on subacute left subdural hematoma with some mass effect on underlying frontal lobe plus a chronic right frontal lobe subdural hematoma. Patient was admitted to the hospitalist service and treated for sepsis. Discussed case with neurosurgery who felt that given advanced complications, patient would not be appropriate candidate for surgical intervention. After patient was seen by palliative care, wife made decision to make patient comfortable as he was felt he only had several days.  Overnight, patient had issues with agitation. Did not respond well to IV Ativan or Benadryl which made him more agitated. However, IV morphine seems to make him more restful.  Assessment/Plan: Principal Problem:   SDH (subdural hematoma) [facility]): Patient now being transitioned toward comfort care. No intervention as per neurosurgery given medical issues and patient's state Active Problems:   COPD (chronic obstructive pulmonary disease) ([place]): Stable at this time   Prostate cancer ([place])   Urothelial cancer ([place])   Acute encephalopathy: Seems to respond well to IV morphine. There have been some issues where he attempts to get out of bed. Should these persist, could consider low-dose morphine drip SIRS [facility]):  Patient met criteria given fever, tachycardia and mild leukocytosis with possible abdominal source. After discussion with wife today, they've decided to stop antibiotics   HTN (hypertension): Stable   CKD (chronic kidney disease), stage III   Code Status: DO NOT RESUSCITATE/Comfort Care  Family Communication: Wife and granddaughter at the bedside   Disposition Plan: Patient be made comfortable. Anticipate he will likely pass within the next 1-2 days    Consultants:  Hospice/palliative care  Case discussed with neurosurgery   Procedures:  None   Antimicrobials:  IV Zosyn and vancomycin 4/17-present   DVT prophylaxis: Now comfort care   Objective: Vitals:   10/18/16 2044 10/18/16 2126 10/19/16 0559 10/19/16 0820  BP: (!) 158/73  (!) 153/79   Pulse: 87  77   Resp: 16  18   Temp: 99.1 F (37.3 C)  98.2 F (36.8 C)   TempSrc: Axillary  Axillary   SpO2: 99% 97% 96% 92%  Weight:      Height:        Intake/Output Summary (Last 24 hours) at 10/19/16 1321 Last data filed at 10/18/16 2008  Gross per 24 hour  Intake          1348.33 ml  Output              800 ml  Net           548.33 ml   Filed Weights   10/17/16 1608 10/17/16 2253  Weight: 56.7 kg (125 lb) 57.4 kg (126 lb 8.7 oz)    Exam:   General: Nonverbal, Somewhat somnolent  Cardiovascular: Regular rate and rhythm, S1-S2   Respiratory: Clear to auscultation bilaterally   Abdomen: Soft, nondistended, hypoactive  bowel sounds  Musculoskeletal: No clubbing or cyanosis, trace pitting edema   Psychiatry: Episodes of agitation intermittently    Data Reviewed: CBC:  Recent Labs Lab 10/17/16 1647 10/18/16 0531  WBC 11.6* 12.4*  NEUTROABS 10.0*  --   HGB 12.0* 11.4*  HCT 36.3* 34.1*  MCV 87.3 88.6  PLT 349 601   Basic Metabolic Panel:  Recent Labs Lab 10/17/16 1647 10/18/16 0531 10/19/16 0527  NA 135 137  --   K 4.1 3.8  --   CL 99* 104  --   CO2 23 23  --   GLUCOSE 133* 102*  --     BUN 23* 19  --   CREATININE 1.29* 1.13 1.17  CALCIUM 8.9 8.6*  --    GFR: Estimated Creatinine Clearance: 40.9 mL/min (by C-G formula based on SCr of 1.17 mg/dL). Liver Function Tests:  Recent Labs Lab 10/17/16 1647  AST 17  ALT 14*  ALKPHOS 51  BILITOT 0.8  PROT 7.8  ALBUMIN 4.1    Recent Labs Lab 10/17/16 1647  LIPASE 24   No results for input(s): AMMONIA in the last 168 hours. Coagulation Profile: No results for input(s): INR, PROTIME in the last 168 hours. Cardiac Enzymes: No results for input(s): CKTOTAL, CKMB, CKMBINDEX, TROPONINI in the last 168 hours. BNP (last 3 results) No results for input(s): PROBNP in the last 8760 hours. HbA1C: No results for input(s): HGBA1C in the last 72 hours. CBG:  Recent Labs Lab 10/18/16 0803 10/19/16 0750 10/19/16 1156  GLUCAP 93 77 66   Lipid Profile: No results for input(s): CHOL, HDL, LDLCALC, TRIG, CHOLHDL, LDLDIRECT in the last 72 hours. Thyroid Function Tests: No results for input(s): TSH, T4TOTAL, FREET4, T3FREE, THYROIDAB in the last 72 hours. Anemia Panel: No results for input(s): VITAMINB12, FOLATE, FERRITIN, TIBC, IRON, RETICCTPCT in the last 72 hours. Urine analysis:    Component Value Date/Time   COLORURINE YELLOW 10/17/2016 1910   APPEARANCEUR CLEAR 10/17/2016 1910   LABSPEC >1.046 (H) 10/17/2016 1910   PHURINE 5.0 10/17/2016 1910   GLUCOSEU NEGATIVE 10/17/2016 1910   HGBUR MODERATE (A) 10/17/2016 1910   BILIRUBINUR NEGATIVE 10/17/2016 1910   KETONESUR 5 (A) 10/17/2016 1910   PROTEINUR NEGATIVE 10/17/2016 1910   UROBILINOGEN 1.0 08/29/2013 0010   NITRITE NEGATIVE 10/17/2016 1910   LEUKOCYTESUR NEGATIVE 10/17/2016 1910   Sepsis Labs: _0 (procalcitonin:4,lacticidven:4)  ) Recent Results (from the past 240 hour(s))  Blood Culture (routine x 2)     Status: None (Preliminary result)   Collection Time: 10/17/16  5:05 PM  Result Value Ref Range Status   Specimen Description BLOOD RIGHT  ANTECUBITAL  Final   Special Requests   Final    BOTTLES DRAWN AEROBIC AND ANAEROBIC Blood Culture adequate volume   Culture   Final    NO GROWTH 2 DAYS Performed at [facility], [place] [address], [place], [place] [address]    Report Status PENDING  Incomplete  Urine culture     Status: None   Collection Time: 10/17/16  5:10 PM  Result Value Ref Range Status   Specimen Description URINE, CLEAN CATCH  Final   Special Requests NONE  Final   Culture   Final    NO GROWTH Performed at [facility], [address] [place], [place] [address]    Report Status 10/19/2016 FINAL  Final      Studies: No results found.  Scheduled Meds: . ipratropium  0.5 mg Nebulization TID  . levalbuterol  1.25 mg Nebulization TID  . sodium chloride flush  3 mL Intravenous Q12H    Continuous Infusions: . sodium chloride 100 mL/hr at 10/19/16 1037  . famotidine (PEPCID) IV Stopped (10/19/16 9791)     LOS: 2 days     Annita Brod, MD [facility] Pager [phone]  If 7PM-7AM, please contact night-coverage [email or web address] Password [email or web address] 10/19/2016, 1:21 PM

## 2016-10-19 NOTE — Progress Notes (Signed)
Daily Progress Note   Patient Name: Larry Stanley       Date: 10/19/2016 DOB: 1934/05/09  Age: 83 y.o. MRN#: [id number] Attending Physician: Annita Brod, MD Primary Care Physician: Philis Fendt, MD Admit Date: 10/17/2016  Reason for Consultation/Follow-up: Terminal Care 83 year old male with past oral history of end-stage prostate cancer with invasive urethral/urothelial cancer plus COPD and stage III chronic kidney disease who wife had noted over the past few days, patient started having increased weakness and decline and refusing to eat. She brought him into the emergency room on 4/17 after he stopped talking. In emergency room, patient found have a white count of 11.6 with otherwise normal labs although CT abdomen and pelvis noted large multilobulated left inguinal mass consistent with metastatic disease and a CT scan had abdomen acute on subacute left subdural hematoma with some mass effect on underlying frontal lobe plus a chronic right frontal lobe subdural hematoma. Patient was admitted to the hospitalist service and treated for sepsis. Discussed case with neurosurgery who felt that given advanced complications, patient would not be appropriate candidate for surgical intervention. I've clarified that patient should be a DO NOT RESUSCITATE and transitioning toward comfort care. Palliative care consulted.  Subjective:  less awake/alert Unable to manage secretions Wife at bedside See below  Length of Stay: 2  Current Medications: Scheduled Meds:  . ipratropium  0.5 mg Nebulization TID  . levalbuterol  1.25 mg Nebulization TID  . sodium chloride flush  3 mL Intravenous Q12H    Continuous Infusions: . sodium chloride 100 mL/hr at 10/19/16 1037  . famotidine (PEPCID) IV Stopped  (10/19/16 1102)  . piperacillin-tazobactam (ZOSYN)  IV 3.375 g (10/19/16 0947)  . vancomycin Stopped (10/19/16 0854)    PRN Meds: acetaminophen **OR** acetaminophen, glycopyrrolate, hydrALAZINE, morphine injection, ondansetron  Physical Exam         Less awake/alert Coarse breath sounds anteriorly S1 S2 Abdomen soft No edema Thin muscle wasting evident  Vital Signs: BP (!) 153/79 (BP Location: Right Arm)   Pulse 77   Temp 98.2 F (36.8 C) (Axillary)   Resp 18   Ht 5\' 6"  (1.676 m)   Wt 57.4 kg (126 lb 8.7 oz)   SpO2 92%   BMI 20.42 kg/m  SpO2: SpO2: 92 % O2 Device: O2 Device: Not Delivered O2  Flow Rate:    Intake/output summary:  Intake/Output Summary (Last 24 hours) at 10/19/16 1255 Last data filed at 10/18/16 2008  Gross per 24 hour  Intake          1348.33 ml  Output              800 ml  Net           548.33 ml   LBM: Last BM Date: 10/17/16 Baseline Weight: Weight: 56.7 kg (125 lb) Most recent weight: Weight: 57.4 kg (126 lb 8.7 oz)       Palliative Assessment/Data:    Flowsheet Rows     Most Recent Value  Intake Tab  Referral Department  Hospitalist  Unit at Time of Referral  Oncology Unit  Palliative Care Primary Diagnosis  Cancer  Palliative Care Type  New Palliative care  Reason for referral  End of Life Care Assistance  Clinical Assessment  Palliative Performance Scale Score  20%  Pain Max last 24 hours  5  Pain Min Last 24 hours  4  Dyspnea Max Last 24 Hours  4  Dyspnea Min Last 24 hours  3  Nausea Max Last 24 Hours  4  Nausea Min Last 24 Hours  3  Psychosocial & Spiritual Assessment  Palliative Care Outcomes  Patient/Family meeting held?  Yes  Who was at the meeting?  patient, wife   Palliative Care Outcomes  Clarified goals of care      Patient Active Problem List   Diagnosis Date Noted  . SDH (subdural hematoma) ([place]) 10/17/2016  . Acute encephalopathy 10/17/2016  . Sepsis ([place]) 10/17/2016  . HTN (hypertension) 10/17/2016  . CKD  (chronic kidney disease), stage III 10/17/2016  . Goals of care, counseling/discussion 08/23/2016  . Urothelial cancer ([place]) 08/23/2016  . Urinary retention 08/29/2013  . UTI (lower urinary tract infection) 08/29/2013  . Dehydration 08/29/2013  . Acute renal failure ([place]) 08/29/2013  . COPD with acute exacerbation ([place]) 08/29/2013  . COPD exacerbation ([place]) 08/29/2013  . Abdominal distention 08/29/2013  . Diarrhea 08/29/2013  . COPD (chronic obstructive pulmonary disease) ([place])   . Prostate cancer [facility])     Palliative Care Assessment & Plan   Patient Profile:    Assessment:  83 year old male with past oral history of end-stage prostate cancer with invasive urethral/urothelial cancer plus COPD and stage III chronic kidney disease who wife had noted over the past few days, patient started having increased weakness and decline and refusing to eat. She brought him into the emergency room on 4/17 after he stopped talking. In emergency room, patient found have a white count of 11.6 with otherwise normal labs although CT abdomen and pelvis noted large multilobulated left inguinal mass consistent with metastatic disease and a CT scan had abdomen acute on subacute left subdural hematoma with some mass effect on underlying frontal lobe plus a chronic right frontal lobe subdural hematoma. Patient was admitted to the hospitalist service and treated for sepsis. Discussed case with neurosurgery who felt that given advanced complications, patient would not be appropriate candidate for surgical intervention.    DO NOT RESUSCITATE and transitioning toward comfort care. Palliative care consulted.   Recommendations/Plan:   full comfort care  Stop antibiotics  Stop labs  Add robinul  Continue morphine IV PRN  Hospice consult  Consider GIP by 10-20-16  Wife does not want patient moved to residential hospice, we discussed about home with hospice, not recommended in my opinion as patient has high  symptom burden.   Prognosis hours to days  Goals of Care and Additional Recommendations:  Limitations on Scope of Treatment: Full Comfort Care  Code Status:    Code Status Orders        Start     Ordered   10/17/16 2059  Do not attempt resuscitation (DNR)  Continuous    Question Answer Comment  In the event of cardiac or respiratory ARREST Do not call a "code blue"   In the event of cardiac or respiratory ARREST Do not perform Intubation, CPR, defibrillation or ACLS   In the event of cardiac or respiratory ARREST Use medication by any route, position, wound care, and other measures to relive pain and suffering. May use oxygen, suction and manual treatment of airway obstruction as needed for comfort.      10/17/16 2100    Code Status History    Date Active Date Inactive Code Status Order ID Comments User Context   10/17/2016  8:30 PM 10/17/2016  9:00 PM DNR [id number]  Margarita Mail, PA-C ED   08/29/2013  6:15 AM 08/30/2013  4:56 PM Full Code [id number]  Phillips Grout, MD Inpatient       Prognosis:   Hours - Days  Discharge Planning:  Anticipated Hospital Death  Care plan was discussed with patient's wife, grand daughter.   Thank you for allowing the Palliative Medicine Team to assist in the care of this patient.   Time In:  12 Time Out: 12.35 Total Time 35 Prolonged Time Billed  no       Greater than 50%  of this time was spent counseling and coordinating care related to the above assessment and plan.  Loistine Chance, MD [phone]  Please contact Palliative Medicine Team phone at [phone] for questions and concerns.

## 2016-10-20 DIAGNOSIS — E43 Unspecified severe protein-calorie malnutrition: Secondary | ICD-10-CM | POA: Diagnosis present

## 2016-10-20 NOTE — Progress Notes (Signed)
Daily Progress Note   Patient Name: Larry Stanley       Date: 10/20/2016 DOB: 1933/10/26  Age: 83 y.o. MRN#: [id number] Attending Physician: Annita Brod, MD Primary Care Physician: Philis Fendt, MD Admit Date: 10/17/2016  Reason for Consultation/Follow-up: Terminal Care 83 year old male with past oral history of end-stage prostate cancer with invasive urethral/urothelial cancer plus COPD and stage III chronic kidney disease who wife had noted over the past few days, patient started having increased weakness and decline and refusing to eat. She brought him into the emergency room on 4/17 after he stopped talking. In emergency room, patient found have a white count of 11.6 with otherwise normal labs although CT abdomen and pelvis noted large multilobulated left inguinal mass consistent with metastatic disease and a CT scan had abdomen acute on subacute left subdural hematoma with some mass effect on underlying frontal lobe plus a chronic right frontal lobe subdural hematoma. Patient was admitted to the hospitalist service and treated for sepsis. Discussed case with neurosurgery who felt that given advanced complications, patient would not be appropriate candidate for surgical intervention. I've clarified that patient should be a DO NOT RESUSCITATE and transitioning toward comfort care. Palliative care consulted.  Subjective:  glazed look, does not meaningfully track me in the room Shallow breathing, some apnea noted Had some terminal fever earlier Appears to have advanced end of life signs and symptoms Appreciate bedside RN assistance Wife not currently at the bedside  See below  Length of Stay: 3  Current Medications: Scheduled Meds:  . dexamethasone  10 mg Intravenous Q6H  .  ipratropium  0.5 mg Nebulization TID  . levalbuterol  1.25 mg Nebulization TID  . sodium chloride flush  3 mL Intravenous Q12H    Continuous Infusions: . sodium chloride 10 mL/hr at 10/19/16 1321    PRN Meds: acetaminophen **OR** acetaminophen, glycopyrrolate, LORazepam, morphine injection, ondansetron  Physical Exam         Less awake/alert, glazed look, distant stare.  Coarse breath sounds anteriorly, shallow breathing, some apnea noted.  S1 S2 Abdomen soft No edema Thin muscle wasting evident  Vital Signs: BP (!) 153/79 (BP Location: Right Arm)   Pulse 77   Temp 98.2 F (36.8 C) (Axillary)   Resp 18   Ht 5\' 6"  (1.676 m)   Wt  57.4 kg (126 lb 8.7 oz)   SpO2 92%   BMI 20.42 kg/m  SpO2: SpO2: 92 % O2 Device: O2 Device: Not Delivered O2 Flow Rate:    Intake/output summary:   Intake/Output Summary (Last 24 hours) at 10/20/16 1125 Last data filed at 10/20/16 0200  Gross per 24 hour  Intake              200 ml  Output              600 ml  Net             -400 ml   LBM: Last BM Date: 10/17/16 Baseline Weight: Weight: 56.7 kg (125 lb) Most recent weight: Weight: 57.4 kg (126 lb 8.7 oz)       Palliative Assessment/Data:    Flowsheet Rows     Most Recent Value  Intake Tab  Referral Department  Hospitalist  Unit at Time of Referral  Oncology Unit  Palliative Care Primary Diagnosis  Cancer  Palliative Care Type  New Palliative care  Reason for referral  End of Life Care Assistance  Clinical Assessment  Palliative Performance Scale Score  20%  Pain Max last 24 hours  5  Pain Min Last 24 hours  4  Dyspnea Max Last 24 Hours  4  Dyspnea Min Last 24 hours  3  Nausea Max Last 24 Hours  4  Nausea Min Last 24 Hours  3  Psychosocial & Spiritual Assessment  Palliative Care Outcomes  Patient/Family meeting held?  Yes  Who was at the meeting?  patient, wife   Palliative Care Outcomes  Clarified goals of care      Patient Active Problem List   Diagnosis Date Noted   . SDH (subdural hematoma) ([place]) 10/17/2016  . Acute encephalopathy 10/17/2016  . Sepsis ([place]) 10/17/2016  . HTN (hypertension) 10/17/2016  . CKD (chronic kidney disease), stage III 10/17/2016  . Goals of care, counseling/discussion 08/23/2016  . Urothelial cancer ([place]) 08/23/2016  . Urinary retention 08/29/2013  . UTI (lower urinary tract infection) 08/29/2013  . Dehydration 08/29/2013  . Acute renal failure ([place]) 08/29/2013  . COPD with acute exacerbation ([place]) 08/29/2013  . COPD exacerbation ([place]) 08/29/2013  . Abdominal distention 08/29/2013  . Diarrhea 08/29/2013  . COPD (chronic obstructive pulmonary disease) ([place])   . Prostate cancer [facility])     Palliative Care Assessment & Plan   Patient Profile:    Assessment:  83 year old male with past oral history of end-stage prostate cancer with invasive urethral/urothelial cancer plus COPD and stage III chronic kidney disease who wife had noted over the past few days, patient started having increased weakness and decline and refusing to eat. She brought him into the emergency room on 4/17 after he stopped talking. In emergency room, patient found have a white count of 11.6 with otherwise normal labs although CT abdomen and pelvis noted large multilobulated left inguinal mass consistent with metastatic disease and a CT scan had abdomen acute on subacute left subdural hematoma with some mass effect on underlying frontal lobe plus a chronic right frontal lobe subdural hematoma. Patient was admitted to the hospitalist service and treated for sepsis. Discussed case with neurosurgery who felt that given advanced complications, patient would not be appropriate candidate for surgical intervention.    DO NOT RESUSCITATE and transitioning toward comfort care. Palliative care consulted.   Recommendations/Plan:   full comfort care Continue prn robinul  Continue morphine IV PRN, will switch to IV Dilaudid PRN  if patient is noted to have more  spasms/twitching movements.    Wife does not want patient moved to residential hospice, we discussed about home with hospice, not recommended in my opinion as patient has high symptom burden.   Prognosis hours to days, possibly final 24 hours now.   Goals of Care and Additional Recommendations:  Limitations on Scope of Treatment: Full Comfort Care  Code Status:    Code Status Orders        Start     Ordered   10/17/16 2059  Do not attempt resuscitation (DNR)  Continuous    Question Answer Comment  In the event of cardiac or respiratory ARREST Do not call a "code blue"   In the event of cardiac or respiratory ARREST Do not perform Intubation, CPR, defibrillation or ACLS   In the event of cardiac or respiratory ARREST Use medication by any route, position, wound care, and other measures to relive pain and suffering. May use oxygen, suction and manual treatment of airway obstruction as needed for comfort.      10/17/16 2100    Code Status History    Date Active Date Inactive Code Status Order ID Comments User Context   10/17/2016  8:30 PM 10/17/2016  9:00 PM DNR [id number]  Margarita Mail, PA-C ED   08/29/2013  6:15 AM 08/30/2013  4:56 PM Full Code [id number]  Phillips Grout, MD Inpatient       Prognosis:   Hours - Days, possibly final 24 hours.   Discharge Planning:  Anticipated Hospital Death  Care plan was discussed with patient's RN, case manager   Thank you for allowing the Palliative Medicine Team to assist in the care of this patient.   Time In:  10 Time Out: 10.25 Total Time 25 Prolonged Time Billed  no       Greater than 50%  of this time was spent counseling and coordinating care related to the above assessment and plan.  Loistine Chance, MD [phone]  Please contact Palliative Medicine Team phone at [phone] for questions and concerns.

## 2016-10-20 NOTE — Progress Notes (Addendum)
PROGRESS NOTE  Larry Stanley WSF:[id number] DOB: 29-Jul-1933 DOA: 10/17/2016 PCP: Philis Fendt, MD  HPI/Recap of past 78 hours: 83 year old male with past oral history of end-stage prostate cancer with invasive urethral/urothelial cancer plus COPD and stage III chronic kidney disease who wife had noted over the past few days, patient started having increased weakness and decline and refusing to eat. She brought him into the emergency room on 4/17 after he stopped talking. In emergency room, patient found have a white count of 11.6 with otherwise normal labs although CT abdomen and pelvis noted large multilobulated left inguinal mass consistent with metastatic disease and a CT scan of the head which noted acute on subacute left subdural hematoma with some mass effect on underlying frontal lobe plus a chronic right frontal lobe subdural hematoma. Patient was admitted to the hospitalist service and treated for sepsis. Discussed case with neurosurgery who felt that given advanced complications, patient would not be appropriate candidate for surgical intervention. After patient was seen by palliative care, wife made decision to make patient comfortable as he was felt he only had several days.  Responded to IV morphine for restfulness & decreasing agitatio.  Last evening, pt's wife noted jerking movements of his arm concerning for seizure.  Pt started on decadron which seemed to have helped.  Today he is less responsive although awake. Breathing more labored.  Assessment/Plan: Principal Problem:   SDH (subdural hematoma) [facility]): Patient now being transitioned toward comfort care. No intervention as per neurosurgery given medical issues and patient's state  Possible seizure-like activity: Possible given subdural. On when necessary Decadron to decrease swelling. Active Problems:   COPD (chronic obstructive pulmonary disease) ([place]): Stable at this time   Prostate cancer ([place])   Urothelial cancer  ([place])   Acute encephalopathy: Seems to respond well to IV morphine. There have been some issues where he attempts to get out of bed. Should these persist, could consider low-dose morphine drip SIRS [facility]): Patient met criteria initially on admission given fever, tachycardia and mild leukocytosis with possible abdominal source. Once he was made comfort care, antibiotics stopped on 4/19.   HTN (hypertension): Stable   CKD (chronic kidney disease), stage III  Severe protein calorie malnutrition: Patient meets criteria in the context of chronic illness. No longer eating. Comfort Care.   Code Status: DO NOT RESUSCITATE/Comfort Care  Family Communication: Left message with wife.  Disposition Plan: Patient looks closer to death. Anticipate he will pass this weekend.   Consultants:  Hospice/palliative care  Case discussed with neurosurgery   Procedures:  None   Antimicrobials:  IV Zosyn and vancomycin 4/17-present   DVT prophylaxis: Now comfort care   Objective: Vitals:   10/19/16 0559 10/19/16 0820 10/19/16 1447 10/19/16 2012  BP: (!) 153/79     Pulse: 77     Resp: 18     Temp: 98.2 F (36.8 C)     TempSrc: Axillary     SpO2: 96% 92% 95% 92%  Weight:      Height:        Intake/Output Summary (Last 24 hours) at 10/20/16 1146 Last data filed at 10/20/16 0200  Gross per 24 hour  Intake              200 ml  Output              600 ml  Net             -400 ml   [employer]  10/17/16 1608 10/17/16 2253  Weight: 56.7 kg (125 lb) 57.4 kg (126 lb 8.7 oz)    Exam:   General: Awake. Nonverbal, does not respond to name or stimulus  Cardiovascular: Regular rate and rhythm, S1-S2   Respiratory: Scattered rales  Abdomen: Soft, nondistended, hypoactive bowel sounds  Musculoskeletal: No clubbing or cyanosis, trace pitting edema   Psychiatry: Episodes of agitation intermittently    Data Reviewed: CBC:  Recent Labs Lab 10/17/16 1647 10/18/16 0531  WBC 11.6*  12.4*  NEUTROABS 10.0*  --   HGB 12.0* 11.4*  HCT 36.3* 34.1*  MCV 87.3 88.6  PLT 349 347   Basic Metabolic Panel:  Recent Labs Lab 10/17/16 1647 10/18/16 0531 10/19/16 0527  NA 135 137  --   K 4.1 3.8  --   CL 99* 104  --   CO2 23 23  --   GLUCOSE 133* 102*  --   BUN 23* 19  --   CREATININE 1.29* 1.13 1.17  CALCIUM 8.9 8.6*  --    GFR: Estimated Creatinine Clearance: 40.9 mL/min (by C-G formula based on SCr of 1.17 mg/dL). Liver Function Tests:  Recent Labs Lab 10/17/16 1647  AST 17  ALT 14*  ALKPHOS 51  BILITOT 0.8  PROT 7.8  ALBUMIN 4.1    Recent Labs Lab 10/17/16 1647  LIPASE 24   No results for input(s): AMMONIA in the last 168 hours. Coagulation Profile: No results for input(s): INR, PROTIME in the last 168 hours. Cardiac Enzymes: No results for input(s): CKTOTAL, CKMB, CKMBINDEX, TROPONINI in the last 168 hours. BNP (last 3 results) No results for input(s): PROBNP in the last 8760 hours. HbA1C: No results for input(s): HGBA1C in the last 72 hours. CBG:  Recent Labs Lab 10/18/16 0803 10/19/16 0750 10/19/16 1156  GLUCAP 93 77 66   Lipid Profile: No results for input(s): CHOL, HDL, LDLCALC, TRIG, CHOLHDL, LDLDIRECT in the last 72 hours. Thyroid Function Tests: No results for input(s): TSH, T4TOTAL, FREET4, T3FREE, THYROIDAB in the last 72 hours. Anemia Panel: No results for input(s): VITAMINB12, FOLATE, FERRITIN, TIBC, IRON, RETICCTPCT in the last 72 hours. Urine analysis:    Component Value Date/Time   COLORURINE YELLOW 10/17/2016 1910   APPEARANCEUR CLEAR 10/17/2016 1910   LABSPEC >1.046 (H) 10/17/2016 1910   PHURINE 5.0 10/17/2016 1910   GLUCOSEU NEGATIVE 10/17/2016 1910   HGBUR MODERATE (A) 10/17/2016 1910   BILIRUBINUR NEGATIVE 10/17/2016 1910   KETONESUR 5 (A) 10/17/2016 1910   PROTEINUR NEGATIVE 10/17/2016 1910   UROBILINOGEN 1.0 08/29/2013 0010   NITRITE NEGATIVE 10/17/2016 1910   LEUKOCYTESUR NEGATIVE 10/17/2016 1910    Sepsis Labs: '@LABRCNTIP'$ (procalcitonin:4,lacticidven:4)  ) Recent Results (from the past 240 hour(s))  Blood Culture (routine x 2)     Status: None (Preliminary result)   Collection Time: 10/17/16  5:05 PM  Result Value Ref Range Status   Specimen Description BLOOD RIGHT ANTECUBITAL  Final   Special Requests   Final    BOTTLES DRAWN AEROBIC AND ANAEROBIC Blood Culture adequate volume   Culture   Final    NO GROWTH 2 DAYS Performed at [facility], [place] [address] [place], [place] [address]    Report Status PENDING  Incomplete  Urine culture     Status: None   Collection Time: 10/17/16  5:10 PM  Result Value Ref Range Status   Specimen Description URINE, CLEAN CATCH  Final   Special Requests NONE  Final   Culture   Final  NO GROWTH Performed at [facility], [place] [address] [place], [place] [address]    Report Status 10/19/2016 FINAL  Final      Studies: No results found.  Scheduled Meds: . dexamethasone  10 mg Intravenous Q6H  . ipratropium  0.5 mg Nebulization TID  . levalbuterol  1.25 mg Nebulization TID  . sodium chloride flush  3 mL Intravenous Q12H    Continuous Infusions: . sodium chloride 10 mL/hr at 10/19/16 1321     LOS: 3 days     Annita Brod, MD [facility] Pager [phone]  If 7PM-7AM, please contact night-coverage [email or web address] Password [facility] 10/20/2016, 11:46 AM

## 2016-10-21 DIAGNOSIS — E43 Unspecified severe protein-calorie malnutrition: Secondary | ICD-10-CM

## 2016-10-21 MED ORDER — MORPHINE SULFATE (PF) 4 MG/ML IV SOLN
2.0000 mg | INTRAVENOUS | Status: DC | PRN
  Administered 2016-10-22 – 2016-10-23 (×6): 2 mg via INTRAVENOUS
  Filled 2016-10-21 (×6): qty 1

## 2016-10-21 MED ORDER — MORPHINE SULFATE (PF) 4 MG/ML IV SOLN
1.0000 mg | Freq: Once | INTRAVENOUS | Status: AC
  Administered 2016-10-21: 1 mg via INTRAVENOUS
  Filled 2016-10-21: qty 1

## 2016-10-21 NOTE — Progress Notes (Signed)
PROGRESS NOTE  Larry Stanley YKD:[id number] DOB: 1934/04/10 DOA: 10/17/2016 PCP: Philis Fendt, MD  HPI/Recap of past 15 hours: 83 year old male with past oral history of end-stage prostate cancer with invasive urethral/urothelial cancer plus COPD and stage III chronic kidney disease who wife had noted over the past few days, patient started having increased weakness and decline and refusing to eat. She brought him into the emergency room on 4/17 after he stopped talking. In emergency room, patient found have a white count of 11.6 with otherwise normal labs although CT abdomen and pelvis noted large multilobulated left inguinal mass consistent with metastatic disease and a CT scan of the head which noted acute on subacute left subdural hematoma with some mass effect on underlying frontal lobe plus a chronic right frontal lobe subdural hematoma. Patient was admitted to the hospitalist service and treated for sepsis. Discussed case with neurosurgery who felt that given advanced complications, patient would not be appropriate candidate for surgical intervention. After patient was seen by palliative care, wife made decision to make patient comfortable as he was felt he only had several days.  Responded to IV morphine for restfulness & decreasing agitatio.  Last evening, pt's wife noted jerking movements of his arm concerning for seizure.  Pt started on decadron which seemed to have helped.  Patient about the same today. Still with labored breathing, still nonverbal. More alert.  Assessment/Plan: Principal Problem:   SDH (subdural hematoma) [facility]): Patient now being transitioned toward comfort care. No intervention as per neurosurgery given medical issues and patient's state  Possible seizure-like activity: Possible given subdural. On when necessary Decadron to decrease swelling. Active Problems:   COPD (chronic obstructive pulmonary disease) ([place]): Stable at this time   Prostate cancer ([place])  Urothelial cancer ([place])   Acute encephalopathy: Seems to respond well to IV morphine. There have been some issues where he attempts to get out of bed. Should these persist, could consider low-dose morphine drip SIRS [facility]): Patient met criteria initially on admission given fever, tachycardia and mild leukocytosis with possible abdominal source. Once he was made comfort care, antibiotics stopped on 4/19.   HTN (hypertension): Stable   CKD (chronic kidney disease), stage III  Severe protein calorie malnutrition: Patient meets criteria in the context of chronic illness. No longer eating. Comfort Care.   Code Status: DO NOT RESUSCITATE/Comfort Care  Family Communication: Left message with wife.  Disposition Plan: Expect expiration inThe hospital   Consultants:  Hospice/palliative care  Case discussed with neurosurgery   Procedures:  None   Antimicrobials:  IV Zosyn and vancomycin 4/17-present   DVT prophylaxis: Now comfort care   Objective: Vitals:   10/19/16 1447 10/19/16 2012 10/20/16 1403 10/20/16 2004  BP:   (!) 158/84   Pulse:   88   Resp:   18   Temp:   98.4 F (36.9 C)   TempSrc:   Oral   SpO2: 95% 92% 96% 93%  Weight:      Height:        Intake/Output Summary (Last 24 hours) at 10/21/16 1256 Last data filed at 10/21/16 1153  Gross per 24 hour  Intake                0 ml  Output              675 ml  Net             -675 ml   Filed Weights   10/17/16 1608 10/17/16 2253  Weight: 56.7 kg (125 lb) 57.4 kg (126 lb 8.7 oz)    Exam: No change from previous day  General: Awake. Nonverbal, does not respond to name or stimulus  Cardiovascular: Regular rate and rhythm, S1-S2   Respiratory: Scattered rales  Abdomen: Soft, nondistended, hypoactive bowel sounds  Musculoskeletal: No clubbing or cyanosis, trace pitting edema   Psychiatry: Episodes of agitation intermittently    Data Reviewed: CBC:  Recent Labs Lab 10/17/16 1647 10/18/16 0531  WBC  11.6* 12.4*  NEUTROABS 10.0*  --   HGB 12.0* 11.4*  HCT 36.3* 34.1*  MCV 87.3 88.6  PLT 349 329   Basic Metabolic Panel:  Recent Labs Lab 10/17/16 1647 10/18/16 0531 10/19/16 0527  NA 135 137  --   K 4.1 3.8  --   CL 99* 104  --   CO2 23 23  --   GLUCOSE 133* 102*  --   BUN 23* 19  --   CREATININE 1.29* 1.13 1.17  CALCIUM 8.9 8.6*  --    GFR: Estimated Creatinine Clearance: 40.9 mL/min (by C-G formula based on SCr of 1.17 mg/dL). Liver Function Tests:  Recent Labs Lab 10/17/16 1647  AST 17  ALT 14*  ALKPHOS 51  BILITOT 0.8  PROT 7.8  ALBUMIN 4.1    Recent Labs Lab 10/17/16 1647  LIPASE 24   No results for input(s): AMMONIA in the last 168 hours. Coagulation Profile: No results for input(s): INR, PROTIME in the last 168 hours. Cardiac Enzymes: No results for input(s): CKTOTAL, CKMB, CKMBINDEX, TROPONINI in the last 168 hours. BNP (last 3 results) No results for input(s): PROBNP in the last 8760 hours. HbA1C: No results for input(s): HGBA1C in the last 72 hours. CBG:  Recent Labs Lab 10/18/16 0803 10/19/16 0750 10/19/16 1156  GLUCAP 93 77 66   Lipid Profile: No results for input(s): CHOL, HDL, LDLCALC, TRIG, CHOLHDL, LDLDIRECT in the last 72 hours. Thyroid Function Tests: No results for input(s): TSH, T4TOTAL, FREET4, T3FREE, THYROIDAB in the last 72 hours. Anemia Panel: No results for input(s): VITAMINB12, FOLATE, FERRITIN, TIBC, IRON, RETICCTPCT in the last 72 hours. Urine analysis:    Component Value Date/Time   COLORURINE YELLOW 10/17/2016 1910   APPEARANCEUR CLEAR 10/17/2016 1910   LABSPEC >1.046 (H) 10/17/2016 1910   PHURINE 5.0 10/17/2016 1910   GLUCOSEU NEGATIVE 10/17/2016 1910   HGBUR MODERATE (A) 10/17/2016 1910   BILIRUBINUR NEGATIVE 10/17/2016 1910   KETONESUR 5 (A) 10/17/2016 1910   PROTEINUR NEGATIVE 10/17/2016 1910   UROBILINOGEN 1.0 08/29/2013 0010   NITRITE NEGATIVE 10/17/2016 1910   LEUKOCYTESUR NEGATIVE 10/17/2016 1910     Sepsis Labs: @LABRCNTIP (procalcitonin:4,lacticidven:4)  ) Recent Results (from the past 240 hour(s))  Blood Culture (routine x 2)     Status: None (Preliminary result)   Collection Time: 10/17/16  5:05 PM  Result Value Ref Range Status   Specimen Description BLOOD RIGHT ANTECUBITAL  Final   Special Requests   Final    BOTTLES DRAWN AEROBIC AND ANAEROBIC Blood Culture adequate volume   Culture   Final    NO GROWTH 4 DAYS Performed at [facility], [address], [place], [place]    Report Status PENDING  Incomplete  Urine culture     Status: None   Collection Time: 10/17/16  5:10 PM  Result Value Ref Range Status   Specimen Description URINE, CLEAN CATCH  Final   Special Requests NONE  Final   Culture   Final  NO GROWTH Performed at [facility], [place] [address] [place], [place] [address]    Report Status 10/19/2016 FINAL  Final      Studies: No results found.  Scheduled Meds: . dexamethasone  10 mg Intravenous Q6H  . ipratropium  0.5 mg Nebulization TID  . levalbuterol  1.25 mg Nebulization TID  . sodium chloride flush  3 mL Intravenous Q12H    Continuous Infusions: . sodium chloride 10 mL/hr at 10/19/16 1321     LOS: 4 days     Annita Brod, MD [facility] Pager [phone]  If 7PM-7AM, please contact night-coverage [email or web address] Password [facility] 10/21/2016, 12:56 PM

## 2016-10-21 NOTE — Progress Notes (Signed)
Daily Progress Note   Patient Name: Larry Stanley       Date: 10/21/2016 DOB: 1933/07/16  Age: 83 y.o. MRN#: [id number] Attending Physician: Annita Brod, MD Primary Care Physician: Philis Fendt, MD Admit Date: 10/17/2016  Reason for Consultation/Follow-up: Terminal Care 83 year old male with past oral history of end-stage prostate cancer with invasive urethral/urothelial cancer plus COPD and stage III chronic kidney disease who wife had noted over the past few days, patient started having increased weakness and decline and refusing to eat. She brought him into the emergency room on 4/17 after he stopped talking. In emergency room, patient found have a white count of 11.6 with otherwise normal labs although CT abdomen and pelvis noted large multilobulated left inguinal mass consistent with metastatic disease and a CT scan had abdomen acute on subacute left subdural hematoma with some mass effect on underlying frontal lobe plus a chronic right frontal lobe subdural hematoma. Patient was admitted to the hospitalist service and treated for sepsis. Discussed case with neurosurgery who felt that given advanced complications, patient would not be appropriate candidate for surgical intervention. I've clarified that patient should be a DO NOT RESUSCITATE and transitioning toward comfort care. Palliative care consulted.  Subjective:  awake, non verbal, Has rapid shallow breathing Appreciate bedside RN assistance Wife not currently at the bedside  See below  Length of Stay: 4  Current Medications: Scheduled Meds:  . dexamethasone  10 mg Intravenous Q6H  . ipratropium  0.5 mg Nebulization TID  . levalbuterol  1.25 mg Nebulization TID  . sodium chloride flush  3 mL Intravenous Q12H     Continuous Infusions: . sodium chloride 10 mL/hr at 10/19/16 1321    PRN Meds: acetaminophen **OR** acetaminophen, glycopyrrolate, LORazepam, morphine injection, ondansetron  Physical Exam           Awake not alert,   Coarse breath sounds anteriorly, shallow breathing, some apnea noted.  S1 S2 Abdomen soft No edema Thin muscle wasting evident  Vital Signs: BP (!) 158/84 (BP Location: Right Arm)   Pulse 88   Temp 98.4 F (36.9 C) (Oral)   Resp 18   Ht 5\' 6"  (1.676 m)   Wt 57.4 kg (126 lb 8.7 oz)   SpO2 93%   BMI 20.42 kg/m  SpO2: SpO2: 93 % O2 Device: O2  Device: Not Delivered O2 Flow Rate:    Intake/output summary:   Intake/Output Summary (Last 24 hours) at 10/21/16 1203 Last data filed at 10/21/16 1153  Gross per 24 hour  Intake                0 ml  Output              675 ml  Net             -675 ml   LBM: Last BM Date: 10/17/16 Baseline Weight: Weight: 56.7 kg (125 lb) Most recent weight: Weight: 57.4 kg (126 lb 8.7 oz)       Palliative Assessment/Data:    Flowsheet Rows     Most Recent Value  Intake Tab  Referral Department  Hospitalist  Unit at Time of Referral  Oncology Unit  Palliative Care Primary Diagnosis  Cancer  Palliative Care Type  New Palliative care  Reason for referral  End of Life Care Assistance  Clinical Assessment  Palliative Performance Scale Score  20%  Pain Max last 24 hours  5  Pain Min Last 24 hours  4  Dyspnea Max Last 24 Hours  4  Dyspnea Min Last 24 hours  3  Nausea Max Last 24 Hours  4  Nausea Min Last 24 Hours  3  Psychosocial & Spiritual Assessment  Palliative Care Outcomes  Patient/Family meeting held?  Yes  Who was at the meeting?  patient, wife   Palliative Care Outcomes  Clarified goals of care      Patient Active Problem List   Diagnosis Date Noted  . Severe protein-calorie malnutrition ([place]) 10/20/2016  . SDH (subdural hematoma) ([place]) 10/17/2016  . Acute encephalopathy 10/17/2016  . Sepsis ([place])  10/17/2016  . HTN (hypertension) 10/17/2016  . CKD (chronic kidney disease), stage III 10/17/2016  . Goals of care, counseling/discussion 08/23/2016  . Urothelial cancer ([place]) 08/23/2016  . Urinary retention 08/29/2013  . UTI (lower urinary tract infection) 08/29/2013  . Dehydration 08/29/2013  . Acute renal failure ([place]) 08/29/2013  . COPD with acute exacerbation ([place]) 08/29/2013  . COPD exacerbation ([place]) 08/29/2013  . Abdominal distention 08/29/2013  . Diarrhea 08/29/2013  . COPD (chronic obstructive pulmonary disease) ([place])   . Prostate cancer [facility])     Palliative Care Assessment & Plan   Patient Profile:    Assessment:  83 year old male with past oral history of end-stage prostate cancer with invasive urethral/urothelial cancer plus COPD and stage III chronic kidney disease who wife had noted over the past few days, patient started having increased weakness and decline and refusing to eat. She brought him into the emergency room on 4/17 after he stopped talking. In emergency room, patient found have a white count of 11.6 with otherwise normal labs although CT abdomen and pelvis noted large multilobulated left inguinal mass consistent with metastatic disease and a CT scan had abdomen acute on subacute left subdural hematoma with some mass effect on underlying frontal lobe plus a chronic right frontal lobe subdural hematoma. Patient was admitted to the hospitalist service and treated for sepsis. Discussed case with neurosurgery who felt that given advanced complications, patient would not be appropriate candidate for surgical intervention.    DO NOT RESUSCITATE and transitioning toward comfort care. Palliative care consulted.   Recommendations/Plan:   full comfort care Continue prn robinul  Continue morphine IV PRN, will switch to IV Dilaudid PRN if patient is noted to have more spasms/twitching movements.    Wife does  not want patient moved to residential hospice, we discussed  about home with hospice with her earlier, not recommended in my opinion as patient has high symptom burden.  Prognosis hours to days,   Goals of Care and Additional Recommendations:  Limitations on Scope of Treatment: Full Comfort Care  Code Status:    Code Status Orders        Start     Ordered   10/17/16 2059  Do not attempt resuscitation (DNR)  Continuous    Question Answer Comment  In the event of cardiac or respiratory ARREST Do not call a "code blue"   In the event of cardiac or respiratory ARREST Do not perform Intubation, CPR, defibrillation or ACLS   In the event of cardiac or respiratory ARREST Use medication by any route, position, wound care, and other measures to relive pain and suffering. May use oxygen, suction and manual treatment of airway obstruction as needed for comfort.      10/17/16 2100    Code Status History    Date Active Date Inactive Code Status Order ID Comments User Context   10/17/2016  8:30 PM 10/17/2016  9:00 PM DNR [id number]  Margarita Mail, PA-C ED   08/29/2013  6:15 AM 08/30/2013  4:56 PM Full Code [id number]  Phillips Grout, MD Inpatient       Prognosis:  Hours - Days,  Discharge Planning:  Anticipated Hospital Death  Care plan was discussed with patient's RN  Thank you for allowing the Palliative Medicine Team to assist in the care of this patient.   Time In:  10 Time Out: 10.25 Total Time 25 Prolonged Time Billed  no       Greater than 50%  of this time was spent counseling and coordinating care related to the above assessment and plan.  Loistine Chance, MD [phone]  Please contact Palliative Medicine Team phone at [phone] for questions and concerns.

## 2016-10-21 NOTE — Progress Notes (Signed)
Pt's wife called this RN stating there were two "troublesome people" who may attempt to visit the patient and that they are not permitted to do so. ED Security desk and ED Registration made aware of the names of the people, Esa Raden and Bartholome Bill. Will continue to monitor pt closely. Carnella [place] I

## 2016-10-22 LAB — CULTURE, BLOOD (ROUTINE X 2)
CULTURE: NO GROWTH
Culture: NO GROWTH
Special Requests: ADEQUATE
Special Requests: ADEQUATE

## 2016-10-22 MED ORDER — MIDAZOLAM HCL 2 MG/2ML IJ SOLN
1.0000 mg | INTRAMUSCULAR | Status: DC | PRN
  Administered 2016-10-23: 1 mg via INTRAVENOUS
  Filled 2016-10-22: qty 2

## 2016-10-22 NOTE — Progress Notes (Signed)
PROGRESS NOTE  Larry Stanley WUJ:[id number] DOB: 25-Jun-1934 DOA: 10/17/2016 PCP: Philis Fendt, MD  HPI/Recap of past 17 hours: 83 year old male with past oral history of end-stage prostate cancer with invasive urethral/urothelial cancer plus COPD and stage III chronic kidney disease who wife had noted over the past few days, patient started having increased weakness and decline and refusing to eat. She brought him into the emergency room on 4/17 after he stopped talking. In emergency room, patient found have a white count of 11.6 with otherwise normal labs although CT abdomen and pelvis noted large multilobulated left inguinal mass consistent with metastatic disease and a CT scan of the head which noted acute on subacute left subdural hematoma with some mass effect on underlying frontal lobe plus a chronic right frontal lobe subdural hematoma. Patient was admitted to the hospitalist service and treated for sepsis. Discussed case with neurosurgery who felt that given advanced complications, patient would not be appropriate candidate for surgical intervention. After patient was seen by palliative care, wife made decision to make patient comfortable as he was felt he only had several days.  Responded to IV morphine for restfulness & decreasing agitation.  On 4/19, pt's wife noted jerking movements of his arm concerning for seizure.  Pt started on decadron which seemed to have helped.  He again had some similar seizure-like activity movements last night.  Assessment/Plan: Principal Problem:   SDH (subdural hematoma) [facility]): Patient now comfort care. No intervention as per neurosurgery given medical issues and patient's state  Possible seizure-like activity: Possible given subdural. On when necessary Decadron to decrease swelling.  Discussed with palliative medicine. Started on morphine drip Active Problems:   COPD (chronic obstructive pulmonary disease) ([place]): Stable at this time   Prostate cancer  ([place])   Urothelial cancer ([place])   Acute encephalopathy: Seems to respond well to IV morphine. There have been some issues where he attempts to get out of bed. Have started morphine drip.  SIRS [facility]): Patient met criteria initially on admission given fever, tachycardia and mild leukocytosis with possible abdominal source. Once he was made comfort care, antibiotics stopped on 4/19.   HTN (hypertension): Stable   CKD (chronic kidney disease), stage III  Severe protein calorie malnutrition: Patient meets criteria in the context of chronic illness. No longer eating. Comfort Care.   Code Status: DO NOT RESUSCITATE/Comfort Care  Family Communication: Wife at the bedside  Disposition Plan: Expect expiration inThe hospital   Consultants:  Hospice/palliative care  Case discussed with neurosurgery   Procedures:  None   Antimicrobials:  IV Zosyn and vancomycin 4/17-4/19  DVT prophylaxis: Now comfort care   Objective: Vitals:   10/19/16 2012 10/20/16 1403 10/20/16 2004 10/22/16 0912  BP:  (!) 158/84    Pulse:  88    Resp:  18    Temp:  98.4 F (36.9 C)    TempSrc:  Oral    SpO2: 92% 96% 93% 93%  Weight:      Height:        Intake/Output Summary (Last 24 hours) at 10/22/16 1248 Last data filed at 10/21/16 1700  Gross per 24 hour  Intake                0 ml  Output                0 ml  Net                0 ml   [employer]  10/17/16 1608 10/17/16 2253  Weight: 56.7 kg (125 lb) 57.4 kg (126 lb 8.7 oz)    Exam:   General: Awake. Nonverbal, does not respond to name or stimulus  Cardiovascular: Regular rate and rhythm, S1-S2   Respiratory: Scattered rales  Abdomen: Soft, nondistended, hypoactive bowel sounds  Musculoskeletal: No clubbing or cyanosis, trace pitting edema   Psychiatry: Chronic confusion   Data Reviewed: CBC:  Recent Labs Lab 10/17/16 1647 10/18/16 0531  WBC 11.6* 12.4*  NEUTROABS 10.0*  --   HGB 12.0* 11.4*  HCT 36.3* 34.1*  MCV  87.3 88.6  PLT 349 062   Basic Metabolic Panel:  Recent Labs Lab 10/17/16 1647 10/18/16 0531 10/19/16 0527  NA 135 137  --   K 4.1 3.8  --   CL 99* 104  --   CO2 23 23  --   GLUCOSE 133* 102*  --   BUN 23* 19  --   CREATININE 1.29* 1.13 1.17  CALCIUM 8.9 8.6*  --    GFR: Estimated Creatinine Clearance: 40.9 mL/min (by C-G formula based on SCr of 1.17 mg/dL). Liver Function Tests:  Recent Labs Lab 10/17/16 1647  AST 17  ALT 14*  ALKPHOS 51  BILITOT 0.8  PROT 7.8  ALBUMIN 4.1    Recent Labs Lab 10/17/16 1647  LIPASE 24   No results for input(s): AMMONIA in the last 168 hours. Coagulation Profile: No results for input(s): INR, PROTIME in the last 168 hours. Cardiac Enzymes: No results for input(s): CKTOTAL, CKMB, CKMBINDEX, TROPONINI in the last 168 hours. BNP (last 3 results) No results for input(s): PROBNP in the last 8760 hours. HbA1C: No results for input(s): HGBA1C in the last 72 hours. CBG:  Recent Labs Lab 10/18/16 0803 10/19/16 0750 10/19/16 1156  GLUCAP 93 77 66   Lipid Profile: No results for input(s): CHOL, HDL, LDLCALC, TRIG, CHOLHDL, LDLDIRECT in the last 72 hours. Thyroid Function Tests: No results for input(s): TSH, T4TOTAL, FREET4, T3FREE, THYROIDAB in the last 72 hours. Anemia Panel: No results for input(s): VITAMINB12, FOLATE, FERRITIN, TIBC, IRON, RETICCTPCT in the last 72 hours. Urine analysis:    Component Value Date/Time   COLORURINE YELLOW 10/17/2016 1910   APPEARANCEUR CLEAR 10/17/2016 1910   LABSPEC >1.046 (H) 10/17/2016 1910   PHURINE 5.0 10/17/2016 1910   GLUCOSEU NEGATIVE 10/17/2016 1910   HGBUR MODERATE (A) 10/17/2016 1910   BILIRUBINUR NEGATIVE 10/17/2016 1910   KETONESUR 5 (A) 10/17/2016 1910   PROTEINUR NEGATIVE 10/17/2016 1910   UROBILINOGEN 1.0 08/29/2013 0010   NITRITE NEGATIVE 10/17/2016 1910   LEUKOCYTESUR NEGATIVE 10/17/2016 1910   Sepsis Labs: _0 (procalcitonin:4,lacticidven:4)  ) Recent  Results (from the past 240 hour(s))  Blood Culture (routine x 2)     Status: None (Preliminary result)   Collection Time: 10/17/16  4:48 PM  Result Value Ref Range Status   Specimen Description LEFT ANTECUBITAL  Final   Special Requests   Final    BOTTLES DRAWN AEROBIC AND ANAEROBIC Blood Culture adequate volume   Culture   Final    NO GROWTH 4 DAYS Performed at [facility], [place] [address], [place], [place] [address]    Report Status PENDING  Incomplete  Blood Culture (routine x 2)     Status: None (Preliminary result)   Collection Time: 10/17/16  5:05 PM  Result Value Ref Range Status   Specimen Description BLOOD RIGHT ANTECUBITAL  Final   Special Requests   Final    BOTTLES DRAWN AEROBIC  AND ANAEROBIC Blood Culture adequate volume   Culture   Final    NO GROWTH 4 DAYS Performed at [facility] [address], [place], [place] [address]    Report Status PENDING  Incomplete  Urine culture     Status: None   Collection Time: 10/17/16  5:10 PM  Result Value Ref Range Status   Specimen Description URINE, CLEAN CATCH  Final   Special Requests NONE  Final   Culture   Final    NO GROWTH Performed at [facility], [address] [place], [place] [address]    Report Status 10/19/2016 FINAL  Final      Studies: No results found.  Scheduled Meds: . dexamethasone  10 mg Intravenous Q6H  . ipratropium  0.5 mg Nebulization TID  . levalbuterol  1.25 mg Nebulization TID  . sodium chloride flush  3 mL Intravenous Q12H    Continuous Infusions: . sodium chloride 10 mL/hr at 10/19/16 1321     LOS: 5 days     Annita Brod, MD [facility] Pager [phone]  If 7PM-7AM, please contact night-coverage [email or web address] Password [facility] 10/22/2016, 12:48 PM

## 2016-10-22 NOTE — Progress Notes (Signed)
Daily Progress Note   Patient Name: Larry Stanley       Date: 10/22/2016 DOB: [birth year]  Age: 83 y.o. MRN#: [id number] Attending Physician: Annita Brod, MD Primary Care Physician: Philis Fendt, MD Admit Date: 10/17/2016  Reason for Consultation/Follow-up: Terminal Care 83 year old male with past oral history of end-stage prostate cancer with invasive urethral/urothelial cancer plus COPD and stage III chronic kidney disease who wife had noted over the past few days, patient started having increased weakness and decline and refusing to eat. She brought him into the emergency room on 4/17 after he stopped talking. In emergency room, patient found have a white count of 11.6 with otherwise normal labs although CT abdomen and pelvis noted large multilobulated left inguinal mass consistent with metastatic disease and a CT scan had abdomen acute on subacute left subdural hematoma with some mass effect on underlying frontal lobe plus a chronic right frontal lobe subdural hematoma. Patient was admitted to the hospitalist service and treated for sepsis. Discussed case with neurosurgery who felt that given advanced complications, patient would not be appropriate candidate for surgical intervention. I've clarified that patient should be a DO NOT RESUSCITATE and transitioning toward comfort care. Palliative care consulted.  Subjective:  awake, not alert non verbal, Has rapid shallow breathing Appreciate bedside RN assistance Wife currently at the bedside, discussed patient's end of life signs and symptoms with her again in detail:  See below  Length of Stay: 5  Current Medications: Scheduled Meds:  . dexamethasone  10 mg Intravenous Q6H  . ipratropium  0.5 mg Nebulization TID  . levalbuterol   1.25 mg Nebulization TID  . sodium chloride flush  3 mL Intravenous Q12H    Continuous Infusions: . sodium chloride 10 mL/hr at 10/19/16 1321    PRN Meds: acetaminophen **OR** acetaminophen, glycopyrrolate, midazolam, morphine injection, ondansetron  Physical Exam           Awake not alert,   Coarse breath sounds anteriorly, shallow breathing S1 S2 Abdomen soft No edema Thin muscle wasting evident  Vital Signs: BP (!) 158/84 (BP Location: Right Arm)   Pulse 88   Temp 98.4 F (36.9 C) (Oral)   Resp 18   Ht 5\' 6"  (1.676 m)   Wt 57.4 kg (126 lb 8.7 oz)   SpO2 93%   BMI  20.42 kg/m  SpO2: SpO2: 93 % O2 Device: O2 Device: Not Delivered O2 Flow Rate:    Intake/output summary:   Intake/Output Summary (Last 24 hours) at 10/22/16 1142 Last data filed at 10/21/16 1700  Gross per 24 hour  Intake                0 ml  Output              200 ml  Net             -200 ml   LBM: Last BM Date: 10/17/16 Baseline Weight: Weight: 56.7 kg (125 lb) Most recent weight: Weight: 57.4 kg (126 lb 8.7 oz)       Palliative Assessment/Data:    Flowsheet Rows     Most Recent Value  Intake Tab  Referral Department  Hospitalist  Unit at Time of Referral  Oncology Unit  Palliative Care Primary Diagnosis  Cancer  Palliative Care Type  New Palliative care  Reason for referral  End of Life Care Assistance  Clinical Assessment  Palliative Performance Scale Score  20%  Pain Max last 24 hours  5  Pain Min Last 24 hours  4  Dyspnea Max Last 24 Hours  4  Dyspnea Min Last 24 hours  3  Nausea Max Last 24 Hours  4  Nausea Min Last 24 Hours  3  Psychosocial & Spiritual Assessment  Palliative Care Outcomes  Patient/Family meeting held?  Yes  Who was at the meeting?  patient, wife   Palliative Care Outcomes  Clarified goals of care      Patient Active Problem List   Diagnosis Date Noted  . Severe protein-calorie malnutrition ([place]) 10/20/2016  . SDH (subdural hematoma) ([place]) 10/17/2016    . Acute encephalopathy 10/17/2016  . Sepsis ([place]) 10/17/2016  . HTN (hypertension) 10/17/2016  . CKD (chronic kidney disease), stage III 10/17/2016  . Goals of care, counseling/discussion 08/23/2016  . Urothelial cancer ([place]) 08/23/2016  . Urinary retention 08/29/2013  . UTI (lower urinary tract infection) 08/29/2013  . Dehydration 08/29/2013  . Acute renal failure ([place]) 08/29/2013  . COPD with acute exacerbation ([place]) 08/29/2013  . COPD exacerbation ([place]) 08/29/2013  . Abdominal distention 08/29/2013  . Diarrhea 08/29/2013  . COPD (chronic obstructive pulmonary disease) ([place])   . Prostate cancer [facility])     Palliative Care Assessment & Plan   Patient Profile:    Assessment:  83 year old male with past oral history of end-stage prostate cancer with invasive urethral/urothelial cancer plus COPD and stage III chronic kidney disease who wife had noted over the past few days, patient started having increased weakness and decline and refusing to eat. She brought him into the emergency room on 4/17 after he stopped talking. In emergency room, patient found have a white count of 11.6 with otherwise normal labs although CT abdomen and pelvis noted large multilobulated left inguinal mass consistent with metastatic disease and a CT scan had abdomen acute on subacute left subdural hematoma with some mass effect on underlying frontal lobe plus a chronic right frontal lobe subdural hematoma. Patient was admitted to the hospitalist service and treated for sepsis. Discussed case with neurosurgery who felt that given advanced complications, patient would not be appropriate candidate for surgical intervention.    DO NOT RESUSCITATE and transitioning toward comfort care. Palliative care consulted.   Recommendations/Plan:   full comfort care Continue prn robinul  Continue morphine IV PRN, will consider Moprhine infusion for comfort care if necessary,  this has been discussed with his wife.     Wife  does not want patient moved to residential hospice, I re discussed this with her again this am (4-22), we discussed about home with hospice with her earlier, not recommended in my opinion as patient has high symptom burden.  Prognosis hours to days,  Patient is not eating, level of awake ness and alertness waxes and wanes, remains non verbal  Goals of Care and Additional Recommendations:  Limitations on Scope of Treatment: Full Comfort Care  Code Status:    Code Status Orders        Start     Ordered   10/17/16 2059  Do not attempt resuscitation (DNR)  Continuous    Question Answer Comment  In the event of cardiac or respiratory ARREST Do not call a "code blue"   In the event of cardiac or respiratory ARREST Do not perform Intubation, CPR, defibrillation or ACLS   In the event of cardiac or respiratory ARREST Use medication by any route, position, wound care, and other measures to relive pain and suffering. May use oxygen, suction and manual treatment of airway obstruction as needed for comfort.      10/17/16 2100    Code Status History    Date Active Date Inactive Code Status Order ID Comments User Context   10/17/2016  8:30 PM 10/17/2016  9:00 PM DNR [id number]  Margarita Mail, PA-C ED   08/29/2013  6:15 AM 08/30/2013  4:56 PM Full Code [id number]  Phillips Grout, MD Inpatient       Prognosis:  Hours - Days,  Discharge Planning:  Anticipated Hospital Death  Care plan was discussed with patient's RN, patient's wife and grand daughter, also discussed with Dr Maryland Pink.   Thank you for allowing the Palliative Medicine Team to assist in the care of this patient.   Time In:  10 Time Out: 10.35 Total Time 35 Prolonged Time Billed  no       Greater than 50%  of this time was spent counseling and coordinating care related to the above assessment and plan.  Loistine Chance, MD [phone]  Please contact Palliative Medicine Team phone at [phone] for questions and concerns.

## 2016-10-23 ENCOUNTER — Inpatient Hospital Stay (HOSPITAL_COMMUNITY)
Admission: AD | Admit: 2016-10-23 | Discharge: 2016-10-24 | DRG: 064 | Disposition: A | Discharge status: Hospice | Source: Ambulatory Visit | Attending: Internal Medicine | Admitting: Internal Medicine

## 2016-10-23 DIAGNOSIS — Z7189 Other specified counseling: Secondary | ICD-10-CM | POA: Diagnosis not present

## 2016-10-23 DIAGNOSIS — Z885 Allergy status to narcotic agent status: Secondary | ICD-10-CM

## 2016-10-23 DIAGNOSIS — A419 Sepsis, unspecified organism: Secondary | ICD-10-CM | POA: Diagnosis not present

## 2016-10-23 DIAGNOSIS — Z87891 Personal history of nicotine dependence: Secondary | ICD-10-CM

## 2016-10-23 DIAGNOSIS — Z515 Encounter for palliative care: Secondary | ICD-10-CM | POA: Diagnosis present

## 2016-10-23 DIAGNOSIS — J449 Chronic obstructive pulmonary disease, unspecified: Secondary | ICD-10-CM | POA: Diagnosis present

## 2016-10-23 DIAGNOSIS — Z79899 Other long term (current) drug therapy: Secondary | ICD-10-CM

## 2016-10-23 DIAGNOSIS — S065XAA Traumatic subdural hemorrhage with loss of consciousness status unknown, initial encounter: Secondary | ICD-10-CM | POA: Diagnosis present

## 2016-10-23 DIAGNOSIS — I6203 Nontraumatic chronic subdural hemorrhage: Secondary | ICD-10-CM | POA: Diagnosis present

## 2016-10-23 DIAGNOSIS — Z888 Allergy status to other drugs, medicaments and biological substances status: Secondary | ICD-10-CM

## 2016-10-23 DIAGNOSIS — C689 Malignant neoplasm of urinary organ, unspecified: Secondary | ICD-10-CM | POA: Diagnosis not present

## 2016-10-23 DIAGNOSIS — N183 Chronic kidney disease, stage 3 (moderate): Secondary | ICD-10-CM | POA: Diagnosis present

## 2016-10-23 DIAGNOSIS — I129 Hypertensive chronic kidney disease with stage 1 through stage 4 chronic kidney disease, or unspecified chronic kidney disease: Secondary | ICD-10-CM | POA: Diagnosis present

## 2016-10-23 DIAGNOSIS — S065X9A Traumatic subdural hemorrhage with loss of consciousness of unspecified duration, initial encounter: Secondary | ICD-10-CM | POA: Diagnosis present

## 2016-10-23 DIAGNOSIS — R4182 Altered mental status, unspecified: Secondary | ICD-10-CM | POA: Diagnosis present

## 2016-10-23 DIAGNOSIS — C61 Malignant neoplasm of prostate: Secondary | ICD-10-CM | POA: Diagnosis present

## 2016-10-23 DIAGNOSIS — N179 Acute kidney failure, unspecified: Secondary | ICD-10-CM | POA: Diagnosis present

## 2016-10-23 DIAGNOSIS — E43 Unspecified severe protein-calorie malnutrition: Secondary | ICD-10-CM | POA: Diagnosis present

## 2016-10-23 DIAGNOSIS — C7919 Secondary malignant neoplasm of other urinary organs: Secondary | ICD-10-CM | POA: Diagnosis present

## 2016-10-23 DIAGNOSIS — Z7951 Long term (current) use of inhaled steroids: Secondary | ICD-10-CM

## 2016-10-23 DIAGNOSIS — Z66 Do not resuscitate: Secondary | ICD-10-CM | POA: Diagnosis present

## 2016-10-23 DIAGNOSIS — I6200 Nontraumatic subdural hemorrhage, unspecified: Secondary | ICD-10-CM | POA: Diagnosis not present

## 2016-10-23 MED ORDER — LEVALBUTEROL HCL 1.25 MG/0.5ML IN NEBU
1.2500 mg | INHALATION_SOLUTION | Freq: Three times a day (TID) | RESPIRATORY_TRACT | Status: DC
  Administered 2016-10-23 – 2016-10-24 (×2): 1.25 mg via RESPIRATORY_TRACT
  Filled 2016-10-23 (×3): qty 0.5

## 2016-10-23 MED ORDER — ACETAMINOPHEN 325 MG PO TABS: 650.0000 mg | ORAL_TABLET | Freq: Four times a day (QID) | ORAL | Status: DC | PRN

## 2016-10-23 MED ORDER — MIDAZOLAM HCL 2 MG/2ML IJ SOLN: 1.0000 mg | INTRAMUSCULAR | Status: DC | PRN

## 2016-10-23 MED ORDER — SODIUM CHLORIDE 0.9 % IV SOLN
1.0000 mg/h | INTRAVENOUS | Status: DC
  Filled 2016-10-23: qty 10

## 2016-10-23 MED ORDER — GLYCOPYRROLATE 0.2 MG/ML IJ SOLN
0.4000 mg | INTRAMUSCULAR | Status: DC | PRN
  Filled 2016-10-23: qty 2

## 2016-10-23 MED ORDER — IPRATROPIUM BROMIDE 0.02 % IN SOLN
0.5000 mg | Freq: Three times a day (TID) | RESPIRATORY_TRACT | Status: DC
  Administered 2016-10-23 – 2016-10-24 (×2): 0.5 mg via RESPIRATORY_TRACT
  Filled 2016-10-23 (×3): qty 2.5

## 2016-10-23 MED ORDER — ONDANSETRON HCL 4 MG/2ML IJ SOLN: 4.0000 mg | Freq: Three times a day (TID) | INTRAMUSCULAR | Status: DC | PRN

## 2016-10-23 MED ORDER — SODIUM CHLORIDE 0.9% FLUSH
3.0000 mL | Freq: Two times a day (BID) | INTRAVENOUS | Status: DC
  Administered 2016-10-23 – 2016-10-24 (×2): 3 mL via INTRAVENOUS

## 2016-10-23 MED ORDER — ACETAMINOPHEN 650 MG RE SUPP
650.0000 mg | Freq: Four times a day (QID) | RECTAL | Status: DC | PRN
Start: 2016-10-23 — End: 2016-10-24

## 2016-10-23 MED ORDER — DEXAMETHASONE SODIUM PHOSPHATE 10 MG/ML IJ SOLN
10.0000 mg | Freq: Four times a day (QID) | INTRAMUSCULAR | Status: DC
  Administered 2016-10-23 – 2016-10-24 (×4): 10 mg via INTRAVENOUS
  Filled 2016-10-23 (×5): qty 1

## 2016-10-23 MED ORDER — SODIUM CHLORIDE 0.9 % IV SOLN: INTRAVENOUS | Status: DC

## 2016-10-23 MED ORDER — SODIUM CHLORIDE 0.9 % IV SOLN
1.0000 mg/h | INTRAVENOUS | Status: DC
  Administered 2016-10-23: 1 mg/h via INTRAVENOUS
  Filled 2016-10-23: qty 10

## 2016-10-23 NOTE — Discharge Summary (Signed)
Discharge Summary  Larry Stanley AST:[id number] DOB: 07/30/1933  PCP: Philis Fendt, MD  Admit date: 10/17/2016 Discharge date: 10/23/2016  Time spent: 25 minutes   Recommendations for Outpatient Follow-up:  1. Patient being discharged from hospital service and transferred to hospice and palliative care under GIP status   Discharge Diagnoses:  Active Hospital Problems   Diagnosis Date Noted  . SDH (subdural hematoma) ([place]) 10/17/2016  . Severe protein-calorie malnutrition ([place]) 10/20/2016  . Acute encephalopathy 10/17/2016  . Sepsis ([place]) 10/17/2016  . HTN (hypertension) 10/17/2016  . CKD (chronic kidney disease), stage III 10/17/2016  . Urothelial cancer ([place]) 08/23/2016  . COPD (chronic obstructive pulmonary disease) ([place])   . Prostate cancer [facility])     Resolved Hospital Problems   Diagnosis Date Noted Date Resolved  No resolved problems to display.    Discharge Condition: Terminal, anticipate he will pass next few days   Diet recommendation: Nothing by mouth   Vitals:   10/23/16 1402 10/23/16 1526  BP: (!) 157/104 (!) 158/86  Pulse: (!) 126 (!) 105  Resp:    Temp: 98.3 F (36.8 C)     History of present illness:  83 year old male with past oral history of end-stage prostate cancer with invasive urethral/urothelial cancer plus COPD and stage III chronic kidney disease who wife had noted over the past few days, patient started having increased weakness and decline and refusing to eat. She brought him into the emergency room on 4/17 after he stopped talking. In emergency room, patient found have a white count of 11.6 with otherwise normal labs although CT abdomen and pelvis noted large multilobulated left inguinal mass consistent with metastatic disease and a CT scan of the head which noted acute on subacute left subdural hematoma with some mass effect on underlying frontal lobe plus a chronic right frontal lobe subdural hematoma. Patient was admitted to the hospitalist  service and treated for sepsis. Discussed case with neurosurgery who felt that given advanced complications, patient would not be appropriate candidate for surgical intervention. After patient was seen by palliative care, wife made decision to make patient comfortable as he was felt he only had several days.   Responded to IV morphine for restfulness & decreasing agitation.  On 4/19, pt's wife noted jerking movements of his arm concerning for seizure.  Pt started on decadron which seemed to have helped.   morphine drip started. Patient transferred over to hospice and palliative care service under GIP status on 4/23.  Hospital Course:  Principal Problem:   SDH (subdural hematoma) [facility]): Patient now comfort care. No intervention as per neurosurgery given medical issues and patient's state  Possible seizure-like activity: Possible given subdural. On when necessary Decadron to decrease swelling.  Discussed with palliative medicine. Started on morphine drip Active Problems:   COPD (chronic obstructive pulmonary disease) ([place]): Stable at this time   Prostate cancer ([place])   Urothelial cancer ([place])   Acute encephalopathy: Seems to respond well to IV morphine. There have been some issues where he attempts to get out of bed. Have started morphine drip.  SIRS [facility]): Patient met criteria initially on admission given fever, tachycardia and mild leukocytosis with possible abdominal source. Once he was made comfort care, antibiotics stopped on 4/19.   HTN (hypertension): Stable   CKD (chronic kidney disease), stage III  Severe protein calorie malnutrition: Patient meets criteria in the context of chronic illness. No longer eating. Comfort Care.  Procedures:  None   Consultations:  Hospice and palliative care  Discharge Exam: BP (!) 158/86   Pulse (!) 105   Temp 98.3 F (36.8 C) (Oral)   Resp 18   Ht '5\' 6"'$  (1.676 m)   Wt 57.4 kg (126 lb 8.7 oz)   SpO2 94%   BMI 20.42 kg/m   General:  Still confused, although says yes and no although inconsistently Cardiovascular: Regular rate and rhythm, S1-S2  Respiratory: Scattered rales   Discharge Instructions You were cared for by a hospitalist during your hospital stay. If you have any questions about your discharge medications or the care you received while you were in the hospital after you are discharged, you can call the unit and asked to speak with the hospitalist on call if the hospitalist that took care of you is not available. Once you are discharged, your primary care physician will handle any further medical issues. Please note that NO REFILLS for any discharge medications will be authorized once you are discharged, as it is imperative that you return to your primary care physician (or establish a relationship with a primary care physician if you do not have one) for your aftercare needs so that they can reassess your need for medications and monitor your lab values.   medicines which will be continued at time of discharge IV Decadron 10 mg every 6 hours IV Robinul 0.4 mg every 4 hours when necessary for secretions Xopenex 1.25 mg nebulizers 3 times a day IV Versed 1 mg every 2 hours when necessary for seizures IV morphine 1 mg per hour continuous drip IV Zofran 4 mg every 8 hours when necessary for nausea Tylenol 650 mg by mouth/PR every 6 hours when necessary for mild pain or fever  Allergies  Allergen Reactions  . Demerol [Meperidine] Diarrhea and Nausea And Vomiting  . Promethazine Swelling    Swelling of both legs      The results of significant diagnostics from this hospitalization (including imaging, microbiology, ancillary and laboratory) are listed below for reference.    Significant Diagnostic Studies: Dg Chest 2 View  Result Date: 10/17/2016 CLINICAL DATA:  Altered mental status, fever, history of prostate cancer EXAM: CHEST  2 VIEW COMPARISON:  09/13/2016 FINDINGS: Cardiomediastinal silhouette is stable.  No infiltrate or pleural effusion. No pulmonary edema. Bony thorax is unremarkable. IMPRESSION: No active cardiopulmonary disease. Electronically Signed   By: Lahoma Crocker M.D.   On: 10/17/2016 16:30   Ct Head Wo Contrast  Result Date: 10/17/2016 CLINICAL DATA:  Lethargy.  Confusion.  Altered mental status. EXAM: CT HEAD WITHOUT CONTRAST TECHNIQUE: Contiguous axial images were obtained from the base of the skull through the vertex without intravenous contrast. COMPARISON:  None. FINDINGS: Brain: There is an acute on subacute subdural hematoma overlying the left cerebral hemisphere. This measures 1.5 cm in maximum thickness and has mass effect upon the left frontal lobe. There is effacement of the underlying frontal lobe sulci. No midline shift noted. The ventricular volumes appear normal. A chronic appearing subdural hematoma overlying the right frontal lobe measures 7 mm in thickness, image 11 of series 2. No evidence for acute brain infarct. Vascular: No hyperdense vessel or unexpected calcification. Skull: Normal. Negative for fracture or focal lesion. Sinuses/Orbits: The paranasal sinuses and mastoid air cells are clear. The calvarium is intact. Other: None. IMPRESSION: 1. Acute on subacute left subdural hematoma is identified which exerts mass effect upon the underlying frontal lobe. 2. Chronic right frontal lobe subdural hematoma. Critical Value/emergent results were called by telephone at the time of interpretation  on 10/17/2016 at 4:47 pm to Dr. Margarita Mail , who verbally acknowledged these results. Electronically Signed   By: Kerby Moors M.D.   On: 10/17/2016 16:48   Ct Abdomen Pelvis W Contrast  Result Date: 10/17/2016 CLINICAL DATA:  Acute onset of failure to thrive. Twelve left inguinal swelling. Stage IV bladder cancer and prostate cancer. Initial encounter. EXAM: CT ABDOMEN AND PELVIS WITH CONTRAST TECHNIQUE: Multidetector CT imaging of the abdomen and pelvis was performed using the standard  protocol following bolus administration of intravenous contrast. CONTRAST:  135m ISOVUE-300 IOPAMIDOL (ISOVUE-300) INJECTION 61% COMPARISON:  PET/CT performed 09/13/2016 FINDINGS: Lower chest: Trace pericardial fluid remains within normal limits. The visualized lung bases are clear. Hepatobiliary: Scattered small hypodensities are noted within the liver, measuring up to 9 mm in size. The gallbladder is grossly unremarkable in appearance. The common bile duct remains normal in caliber. Pancreas: The pancreas is within normal limits. Spleen: The spleen is unremarkable in appearance. Adrenals/Urinary Tract: The adrenal glands are unremarkable in appearance. Scattered bilateral renal cysts are seen. There is no evidence of hydronephrosis. No renal or ureteral stones are identified. No perinephric stranding is seen. Stomach/Bowel: The stomach is unremarkable in appearance. The small bowel is within normal limits. The appendix is normal in caliber, without evidence of appendicitis. Diffuse diverticulosis noted along the distal transverse, descending and sigmoid colon, without evidence of diverticulitis. The small bowel is grossly unremarkable in appearance. The stomach is within normal limits, with underlying postoperative change. Vascular/Lymphatic: Diffuse calcification is seen along the abdominal aorta and its branches. The abdominal aorta is otherwise grossly unremarkable. The inferior vena cava is grossly unremarkable. Enlarged retroperitoneal nodes measure up to 1.7 cm in short axis. An enlarged left pelvic sidewall node measures 1.9 cm in short axis. A large multilobulated left inguinal mass is seen, measuring approximately 7.7 x 4.4 x 3.4 cm. Reproductive: The bladder is mildly distended and grossly unremarkable. Scattered brachytherapy seeds are seen about the prostate bed. Other: No additional soft tissue abnormalities are seen. Musculoskeletal: No acute osseous abnormalities are identified. Endplate sclerotic  change is noted at L2-L3. The visualized musculature is unremarkable in appearance. IMPRESSION: 1. Large multilobulated left inguinal mass noted, measuring 7.7 x 4.4 x 3.4 cm, reflecting metastatic disease. 2. Enlarged retroperitoneal nodes measure up to 1.7 cm in short axis. Enlarged left pelvic sidewall node measures 1.9 cm in short axis. 3. Diffuse diverticulosis along the distal transverse, descending and sigmoid colon, without evidence of diverticulitis. 4. Diffuse aortic atherosclerosis. 5. Scattered small nonspecific hypodensities within the liver, measuring up to 9 mm in size. 6. Bilateral renal cysts seen. Electronically Signed   By: JGarald BaldingM.D.   On: 10/17/2016 19:13    Microbiology: Recent Results (from the past 240 hour(s))  Blood Culture (routine x 2)     Status: None   Collection Time: 10/17/16  4:48 PM  Result Value Ref Range Status   Specimen Description LEFT ANTECUBITAL  Final   Special Requests   Final    BOTTLES DRAWN AEROBIC AND ANAEROBIC Blood Culture adequate volume   Culture   Final    NO GROWTH 5 DAYS Performed at [facility] [address] [place] [id number]   Report Status 10/22/2016 FINAL  Final  Blood Culture (routine x 2)     Status: None   Collection Time: 10/17/16  5:05 PM  Result Value Ref Range Status   Specimen Description BLOOD RIGHT ANTECUBITAL  Final   Special Requests  Final    BOTTLES DRAWN AEROBIC AND ANAEROBIC Blood Culture adequate volume   Culture   Final    NO GROWTH 5 DAYS Performed at [facility], [place] [address] [place], [place] [address]    Report Status 10/22/2016 FINAL  Final  Urine culture     Status: None   Collection Time: 10/17/16  5:10 PM  Result Value Ref Range Status   Specimen Description URINE, CLEAN CATCH  Final   Special Requests NONE  Final   Culture   Final    NO GROWTH Performed at [facility], [place] [address], [place], [place] [address]    Report Status 10/19/2016 FINAL  Final       Labs: Basic Metabolic Panel:  Recent Labs Lab 10/17/16 1647 10/18/16 0531 10/19/16 0527  NA 135 137  --   K 4.1 3.8  --   CL 99* 104  --   CO2 23 23  --   GLUCOSE 133* 102*  --   BUN 23* 19  --   CREATININE 1.29* 1.13 1.17  CALCIUM 8.9 8.6*  --    Liver Function Tests:  Recent Labs Lab 10/17/16 1647  AST 17  ALT 14*  ALKPHOS 51  BILITOT 0.8  PROT 7.8  ALBUMIN 4.1    Recent Labs Lab 10/17/16 1647  LIPASE 24   No results for input(s): AMMONIA in the last 168 hours. CBC:  Recent Labs Lab 10/17/16 1647 10/18/16 0531  WBC 11.6* 12.4*  NEUTROABS 10.0*  --   HGB 12.0* 11.4*  HCT 36.3* 34.1*  MCV 87.3 88.6  PLT 349 329   Cardiac Enzymes: No results for input(s): CKTOTAL, CKMB, CKMBINDEX, TROPONINI in the last 168 hours. BNP: BNP (last 3 results) No results for input(s): BNP in the last 8760 hours.  ProBNP (last 3 results) No results for input(s): PROBNP in the last 8760 hours.  CBG:  Recent Labs Lab 10/18/16 0803 10/19/16 0750 10/19/16 1156  GLUCAP 93 77 66       Signed:  Annita Brod, MD [facility] 10/23/2016, 3:53 PM

## 2016-10-23 NOTE — Progress Notes (Signed)
Daily Progress Note   Patient Name: Larry Stanley       Date: 10/23/2016 DOB: Nov 16, 1933  Age: 83 y.o. MRN#: [id number] Attending Physician: Annita Brod, MD Primary Care Physician: Philis Fendt, MD Admit Date: 10/17/2016  Reason for Consultation/Follow-up: Terminal Care 83 year old male with past oral history of end-stage prostate cancer with invasive urethral/urothelial cancer plus COPD and stage III chronic kidney disease who wife had noted over the past few days, patient started having increased weakness and decline and refusing to eat. She brought him into the emergency room on 4/17 after he stopped talking. In emergency room, patient found have a white count of 11.6 with otherwise normal labs although CT abdomen and pelvis noted large multilobulated left inguinal mass consistent with metastatic disease and a CT scan had abdomen acute on subacute left subdural hematoma with some mass effect on underlying frontal lobe plus a chronic right frontal lobe subdural hematoma. Patient was admitted to the hospitalist service and treated for sepsis. Discussed case with neurosurgery who felt that given advanced complications, patient would not be appropriate candidate for surgical intervention. I've clarified that patient should be a DO NOT RESUSCITATE and transitioning toward comfort care. Palliative care consulted.  Subjective:  awake, alert   Wife at bedside, she states that the patient is more alert, is able to answer back in few words to her appropriately Does not appear to be in distress currently, was started on morphine drip on 4-22, also on Decadron  See below  Length of Stay: 6  Current Medications: Scheduled Meds:  . dexamethasone  10 mg Intravenous Q6H  . ipratropium  0.5 mg  Nebulization TID  . levalbuterol  1.25 mg Nebulization TID  . sodium chloride flush  3 mL Intravenous Q12H    Continuous Infusions: . sodium chloride 10 mL/hr at 10/19/16 1321    PRN Meds: acetaminophen **OR** acetaminophen, glycopyrrolate, midazolam, morphine injection, ondansetron  Physical Exam           Awake  alert,   Regular non labored work of breathing.  S1 S2 Abdomen soft No edema Thin muscle wasting evident  Vital Signs: BP (!) 158/84 (BP Location: Right Arm)   Pulse 88   Temp 98.4 F (36.9 C) (Oral)   Resp 18   Ht 5\' 6"  (1.676 m)  Wt 57.4 kg (126 lb 8.7 oz)   SpO2 92%   BMI 20.42 kg/m  SpO2: SpO2: 92 % O2 Device: O2 Device: Not Delivered O2 Flow Rate:    Intake/output summary:   Intake/Output Summary (Last 24 hours) at 10/23/16 1017 Last data filed at 10/22/16 2235  Gross per 24 hour  Intake                3 ml  Output              300 ml  Net             -297 ml   LBM: Last BM Date: 10/17/16 Baseline Weight: Weight: 56.7 kg (125 lb) Most recent weight: Weight: 57.4 kg (126 lb 8.7 oz)       Palliative Assessment/Data:    Flowsheet Rows     Most Recent Value  Intake Tab  Referral Department  Hospitalist  Unit at Time of Referral  Oncology Unit  Palliative Care Primary Diagnosis  Cancer  Palliative Care Type  New Palliative care  Reason for referral  End of Life Care Assistance  Clinical Assessment  Palliative Performance Scale Score  20%  Pain Max last 24 hours  5  Pain Min Last 24 hours  4  Dyspnea Max Last 24 Hours  4  Dyspnea Min Last 24 hours  3  Nausea Max Last 24 Hours  4  Nausea Min Last 24 Hours  3  Psychosocial & Spiritual Assessment  Palliative Care Outcomes  Patient/Family meeting held?  Yes  Who was at the meeting?  patient, wife   Palliative Care Outcomes  Clarified goals of care      Patient Active Problem List   Diagnosis Date Noted  . Severe protein-calorie malnutrition ([place]) 10/20/2016  . SDH (subdural  hematoma) ([place]) 10/17/2016  . Acute encephalopathy 10/17/2016  . Sepsis ([place]) 10/17/2016  . HTN (hypertension) 10/17/2016  . CKD (chronic kidney disease), stage III 10/17/2016  . Goals of care, counseling/discussion 08/23/2016  . Urothelial cancer ([place]) 08/23/2016  . Urinary retention 08/29/2013  . UTI (lower urinary tract infection) 08/29/2013  . Dehydration 08/29/2013  . Acute renal failure ([place]) 08/29/2013  . COPD with acute exacerbation ([place]) 08/29/2013  . COPD exacerbation ([place]) 08/29/2013  . Abdominal distention 08/29/2013  . Diarrhea 08/29/2013  . COPD (chronic obstructive pulmonary disease) ([place])   . Prostate cancer [facility])     Palliative Care Assessment & Plan   Patient Profile:    Assessment:  83 year old male with past oral history of end-stage prostate cancer with invasive urethral/urothelial cancer plus COPD and stage III chronic kidney disease who wife had noted over the past few days, patient started having increased weakness and decline and refusing to eat. She brought him into the emergency room on 4/17 after he stopped talking. In emergency room, patient found have a white count of 11.6 with otherwise normal labs although CT abdomen and pelvis noted large multilobulated left inguinal mass consistent with metastatic disease and a CT scan had abdomen acute on subacute left subdural hematoma with some mass effect on underlying frontal lobe plus a chronic right frontal lobe subdural hematoma. Patient was admitted to the hospitalist service and treated for sepsis. Discussed case with neurosurgery who felt that given advanced complications, patient would not be appropriate candidate for surgical intervention.    DO NOT RESUSCITATE and transitioning toward comfort care. Palliative care consulted.   Recommendations/Plan:   full comfort care Continue prn robinul  Continue morphine IV infusion for comfort care     Wife does not want patient moved to residential hospice, I  re discussed this with her again   (4-22), we discussed about home with hospice with her earlier, not recommended in my opinion as patient has high symptom burden.  Prognosis hours to days,  Patient is not eating, level of awake ness and alertness waxes and wanes, remains non verbal It is possible that the patient is rallying, he is likely getting transient benefit from the decadron as well as now the morphine drip is contributing to his overall sense of well being and comfort. CT head from several days ago with acute on chronic brain bleed, along with mass effect, not eating, wife wants full comfort care.   I re discussed with wife about goals of care, she is thankful for the care being provided here, does not want to move him. We will continue to support her and the patient holistically.  Consider hospice consult and will need to pursue GIP by 10-24-16 if deemed appropriate.   Goals of Care and Additional Recommendations:  Limitations on Scope of Treatment: Full Comfort Care  Code Status:    Code Status Orders        Start     Ordered   10/17/16 2059  Do not attempt resuscitation (DNR)  Continuous    Question Answer Comment  In the event of cardiac or respiratory ARREST Do not call a "code blue"   In the event of cardiac or respiratory ARREST Do not perform Intubation, CPR, defibrillation or ACLS   In the event of cardiac or respiratory ARREST Use medication by any route, position, wound care, and other measures to relive pain and suffering. May use oxygen, suction and manual treatment of airway obstruction as needed for comfort.      10/17/16 2100    Code Status History    Date Active Date Inactive Code Status Order ID Comments User Context   10/17/2016  8:30 PM 10/17/2016  9:00 PM DNR [id number]  Margarita Mail, PA-C ED   08/29/2013  6:15 AM 08/30/2013  4:56 PM Full Code [id number]  Phillips Grout, MD Inpatient       Prognosis:  Hours - Days,  Discharge Planning:  Anticipated  Hospital Death  Care plan was discussed with patient's RN, patient's wife and grand daughter, also discussed with Dr Maryland Pink.   Thank you for allowing the Palliative Medicine Team to assist in the care of this patient.   Time In:  9 Time Out: 9.25 Total Time 25 Prolonged Time Billed  no       Greater than 50%  of this time was spent counseling and coordinating care related to the above assessment and plan.  Loistine Chance, MD [phone]  Please contact Palliative Medicine Team phone at [phone] for questions and concerns.

## 2016-10-23 NOTE — H&P (Deleted)
  The note originally documented on this encounter has been moved the the encounter in which it belongs.  

## 2016-10-23 NOTE — Care Management Important Message (Signed)
Important Message  Patient Details  Name: Lawerance Matsuo MRN: [id number] Date of Birth: 02/27/1934   Medicare Important Message Given:  Yes    Kerin Salen 10/23/2016, 11:31 AMImportant Message  Patient Details  Name: Keller Bounds MRN: [id number] Date of Birth: 1934-05-28   Medicare Important Message Given:  Yes    Kerin Salen 10/23/2016, 11:31 AM

## 2016-10-23 NOTE — Progress Notes (Signed)
Inpatient [facility] 1443- [facility]- RN visit ° °Received request from Cookie, CMRN for evaluation for GIP level of care eligibilty; per attending MD patient prognosis is hours to days, seen by PMT who is in agreement.  Patient seen at bedside and patient information reviewed with [facility] medical Dr. Carlos Monguilod and eligibility confirmed.  Related GIP admission, [facility] diagnosis Prostate CA (C61). °At time of visit, patient not responding to verbal stimuli.  His eyes are open and he is looking towards his wife.  His respirations are normal at this time, however MD reported intermittent episodes of difficulty breathing. °Comfort medications in place per MAR- Scheduled Decadron 10 mg IV Q 6hours, scheduled nebulizer treatments TID,  Mrophine infusion at 1mg/hr IV, and Versed 1 mg Q 2 hours IV PRN agitation.  Staff RN, Jennifer closely monitoring patient for effectiveness of medications and adjusting/administering accordingly per orders.  Spoke with wife, Larry Stanley, who verbalized that she is having difficulty processing how quickly her husband has declined.  Per her request, contacted Dr. Krishnan who met with her to provide additional support.   °Registration visit completed with wife, Larry Stanley and paperwork provided to Larry Stanley at admitting. °Larry Stanley denied wanting any support from the [facility] chaplain at this time. °[facility] will continue to follow patient/family daily along with the PMT attending. ° °Please contact [facility] at [phone] for any hospice needs. ° °Please contact PMT attending at [phone] for symptom management needs and at time of death. ° °Thank you, °Larry Wilkinson RN, BSN °[phone] °

## 2016-10-23 NOTE — H&P (Signed)
Larry Stanley is an 83 y.o. male.   Chief Complaint: admitted with altered mental status, ongoing decline, sepsis, acute on chronic SDH.   HPI:  83 year old male with past oral history of end-stage prostate cancer with invasive urethral/urothelial cancer plus COPD and stage III chronic kidney disease who wife had noted over the past few days, patient started having increased weakness and decline and refusing to eat. She brought him into the emergency room on 4/17 after he stopped talking. In emergency room, patient found have a white count of 11.6 with otherwise normal labs although CT abdomen and pelvis noted large multilobulated left inguinal mass consistent with metastatic disease and a CT scan of the head which noted acute on subacute left subdural hematoma with some mass effect on underlying frontal lobe plus a chronic right frontal lobe subdural hematoma. Patient was admitted to the hospitalist service and treated for sepsis. At the time of admission, case was discussed with neurosurgery who felt that given advanced complications, patient would not be appropriate candidate for surgical intervention. Hence, patient was seen by palliative care, wife made decision to make patient comfortable as it was felt that he only had several days.   Responded to IV morphine given for restless ness and agitation. On 4/19, pt's wife noted jerking movements of his arm concerning for seizure. Pt started on decadron which seemed to have helped.  morphine drip started. Patient transferred over to hospice and palliative care service under GIP status on 4/23. Patient's mental status continues to wax and wane, has high risk for seizures, no history of fall.  Oncology notes reviewed, patient had refused treatments, wife preferred to continue with comfort measures here in the hospital.   Past Medical History:  Diagnosis Date  . COPD (chronic obstructive pulmonary disease) ([place])   . Hypertension   . Prostate cancer  [facility])     Past Surgical History:  Procedure Laterality Date  . gastric ulcer repair    . HERNIA REPAIR      No family history on file. Social History:  reports that he quit smoking about 5 years ago. His smoking use included Cigarettes. He has never used smokeless tobacco. He reports that he does not drink alcohol or use drugs.  Allergies:  Allergies  Allergen Reactions  . Demerol [Meperidine] Diarrhea and Nausea And Vomiting  . Promethazine Swelling    Swelling of both legs    Medications Prior to Admission  Medication Sig Dispense Refill  . acetaminophen (TYLENOL) 500 MG tablet Take 1,000 mg by mouth every 6 (six) hours as needed (headache).    Marland Kitchen albuterol (PROVENTIL HFA;VENTOLIN HFA) 108 (90 BASE) MCG/ACT inhaler Inhale into the lungs every 6 (six) hours as needed for wheezing or shortness of breath.    Marland Kitchen albuterol (PROVENTIL) (2.5 MG/3ML) 0.083% nebulizer solution Take 2.5 mg by nebulization every 6 (six) hours as needed for wheezing (SOB).    . budesonide-formoterol (SYMBICORT) 160-4.5 MCG/ACT inhaler Inhale 2 puffs into the lungs 2 (two) times daily.    Marland Kitchen esomeprazole (NEXIUM) 40 MG capsule Take 40 mg by mouth daily as needed (heartburn).     . losartan-hydrochlorothiazide (HYZAAR) 50-12.5 MG tablet Take 1 tablet by mouth daily.    . tamsulosin (FLOMAX) 0.4 MG CAPS Take 0.8 mg by mouth daily.     . Calcium Carbonate-Vitamin D (CALCIUM + D PO) Take 1 tablet by mouth daily. Calcium 600 +D   800UI      No results found for this or any  previous visit (from the past 48 hour(s)). No results found.  ROS Non verbal  Blood pressure (!) 158/86, pulse (!) 105, temperature 98.3 F (36.8 C), temperature source Oral, resp. rate 18, height 5\' 6"  (1.676 m), weight 57.4 kg (126 lb 8.7 oz), SpO2 94 %. Physical Exam  Awake not alert S2 S2 Shallow breathing now Abdomen soft Non verbal No edema Has foley catheter  Assessment/Plan  Advanced prostate, invasive urothelial cancer. (  this will ultimately, be the cause of his death) Actively dying Acute on chronic SDH on admission, no history of fall Declining functional status at home.  Sepsis, likely abdominal etiology on admission, antibiotics stopped after focus was to be exclusively on comfort care.  Severe protein calorie malnutrition, not eating for several days now, discussed with wife about no role of artificial nutrition and hydration at end of life.   PLAN:  IV Decadron 10 mg every 6 hours IV Robinul 0.4 mg every 4 hours when necessary for secretions Xopenex 1.25 mg nebulizers 3 times a day IV Versed 1 mg every 2 hours when necessary for seizures IV morphine 1 mg per hour continuous drip IV Zofran 4 mg every 8 hours when necessary for nausea Tylenol 650 mg by mouth/PR every 6 hours when necessary for mild pain    Continue other comfort measures Prognosis hours to days Continue [facility] support while patient is on GIP level of service Benzos PRN for seizures or rest less ness.   Loistine Chance, MD 10/23/2016, 4:25 PM

## 2016-10-23 NOTE — Progress Notes (Signed)
Pt's family asking if he can be evaluated with a swallow study. Family states that pt has been asking for water and would like to try and eat/drink something.

## 2016-10-24 ENCOUNTER — Encounter (HOSPITAL_COMMUNITY): Payer: Self-pay

## 2016-10-24 DIAGNOSIS — I6200 Nontraumatic subdural hemorrhage, unspecified: Secondary | ICD-10-CM

## 2016-10-24 DIAGNOSIS — S065XAA Traumatic subdural hemorrhage with loss of consciousness status unknown, initial encounter: Secondary | ICD-10-CM | POA: Diagnosis present

## 2016-10-24 DIAGNOSIS — Z7189 Other specified counseling: Secondary | ICD-10-CM

## 2016-10-24 DIAGNOSIS — C61 Malignant neoplasm of prostate: Secondary | ICD-10-CM

## 2016-10-24 DIAGNOSIS — S065X9A Traumatic subdural hemorrhage with loss of consciousness of unspecified duration, initial encounter: Secondary | ICD-10-CM | POA: Diagnosis present

## 2016-10-24 MED ORDER — GLYCOPYRROLATE 0.2 MG/ML IJ SOLN: 0.4000 mg | INTRAMUSCULAR | 0 refills | Status: DC | PRN

## 2016-10-24 MED ORDER — ACETAMINOPHEN 325 MG PO TABS: 650.0000 mg | ORAL_TABLET | Freq: Four times a day (QID) | ORAL | 0 refills | Status: DC | PRN

## 2016-10-24 MED ORDER — ACETAMINOPHEN 650 MG RE SUPP: 650.0000 mg | Freq: Four times a day (QID) | RECTAL | 0 refills | Status: DC | PRN

## 2016-10-24 MED ORDER — ONDANSETRON HCL 4 MG/2ML IJ SOLN: 4.0000 mg | Freq: Three times a day (TID) | INTRAMUSCULAR | 0 refills | Status: DC | PRN

## 2016-10-24 NOTE — Discharge Summary (Signed)
Physician Discharge Summary  Patient ID: Larry Stanley MRN: [id number] DOB/AGE: 1934-01-08 83 y.o.  Admit date: 10/23/2016 Discharge date: 10/24/2016  Admission Diagnoses: Prostate cancer [facility])   Acute renal failure ([place])   Goals of care, counseling/discussion   Severe protein-calorie malnutrition [facility])  Discharge Diagnoses:  Active Problems:   Prostate cancer ([place])   Acute renal failure ([facility])   Goals of care, counseling/discussion   Severe protein-calorie malnutrition ([place])   Discharged Condition: stable  Hospital Course: 83 year old male with past oral history of end-stage prostate cancer with invasive urethral/urothelial cancer plus COPD and stage III chronic kidney disease admitted under hospice GIP for subacute left subdural hematoma with uncontrolled symptoms including pain, SOB and concern for development of seizures.  He was continued on continuous morphine infusion as well as decadron for comfort with good control of his symptoms.  Today, Larry Stanley has stabilized and is more alert and active than he has been for several days.  He is tolerating sips and bites and has been able to transfer to the toilet on his own.  I met with his wife in conjunction with Erling Conte from [facility].  We discussed with her his clinical course over the past several days including his improvement from yesterday.  We discussed options to transition from the hospital with continued hospice care, including residential hospice placement at [facility] vs transitioning home with the support of hospice.  His wife is agreeable to transition to [facility] for end of life care.  Symptom management to be reviewed and further comfort medications per orders of Dr. Tomasa Hosteller with [facility] on arrival to [facility].  Consults: None  Discharge Exam: Blood pressure (!) 169/98, pulse (!) 105, temperature 98 F (36.7 C), temperature source Oral, resp. rate 18, SpO2 94 %. General: Alert, awake, in no acute distress.  Mild confusion and mild slurred speech  HEENT: No bruits, no goiter, no JVD Heart: Regular rate and rhythm. No murmur appreciated. Lungs: Fair air movement, clear Abdomen: Soft, nontender, nondistended, positive bowel sounds.  Ext: No significant edema Skin: Warm and dry  Disposition: [facility]-Hospice/[facility]  Discharge Instructions    Diet - low sodium heart healthy    Complete by:  As directed    Discharge instructions    Complete by:  As directed    Medication regimen to be reviewed and modified by hospice MD on admission to [facility]   Increase activity slowly    Complete by:  As directed      Allergies as of 10/24/2016      Reactions   Demerol [meperidine] Diarrhea, Nausea And Vomiting   Promethazine Swelling   Swelling of both legs      Medication List    STOP taking these medications   albuterol 1.25 MG/3ML nebulizer solution Commonly known as:  ACCUNEB   budesonide-formoterol 160-4.5 MCG/ACT inhaler Commonly known as:  SYMBICORT   losartan-hydrochlorothiazide 50-12.5 MG tablet Commonly known as:  HYZAAR   tamsulosin 0.4 MG Caps capsule Commonly known as:  CBJSEG        Signed: Micheline Rough 10/24/2016, 2:32 PM

## 2016-10-24 NOTE — Progress Notes (Signed)
Called by video monitor.  Pt pulled out both IVs.  Tips intact.  Morphine 244ml wasted in sink w/ Marissa Long, RN.

## 2016-10-24 NOTE — Progress Notes (Signed)
[facility] 1443--[facility] RN Visit at 0900 am  This is a related, covered GIP admission from 10/23/16 for increased weakness and decline/refusing to eat per Dr. Konrad Dolores. [facility] diagnosis is prostate cancer. Patient is a DNR. Patient is a new In-patient GIP patient for [facility].    Patient seen in room. Family not present at time of visit. Patient was alert this morning and answering questions.  Patient was appropriate in answering questions.  Patient had been up and down several times during the night to get to his bedside commode per Elzie Rings, Therapist, sports.   Patient is asking to eat.  Patient presenting very well this morning.  Denies any patient and NAD at this time.   Spoke with Dr. Domingo Cocking, PMT, patient appears to have rallied somewhat and stabilized condition.  Patient is appropriate to move to [facility].  Dr. Domingo Cocking is meeting with patient's wife today at 1230pm to discuss move to [facility].   Patient currently receiving:  Dexamethasone (DECADRON) injection 10 mg, Dose 10 mg, Q6H via IV; breathing treatments TID.  Continuous medication is 0.9% sodium chloride infusion, Rate 10 mL/hr, Continuous via IV; morphine 250 mg in sodium chloride 0.9% 250 mL (1 mg/mL) infusion, Rate 1 mL/hr, Dose 1 mg/hr, Continuous via IV.  Patient has not been covered with any PRN medication thus far today.   Please feel free to contact with any hospice related questions or concerns.  Thank you,  Edyth Gunnels, RN, [place] [facility] Liaison [phone]  All hospital liaison's are now on [place].

## 2016-10-24 NOTE — Progress Notes (Signed)
Pt transferred to [facility] at this time via [place].  Pt wife in possession of personal belongings.  [facility] staff in possession of transfer packet.

## 2016-10-24 NOTE — Progress Notes (Signed)
CSW notified and informed by [facility] liaison Harmon Pier, that patient is going to [facility]. CSW spoke with patient's wife and granddaughter regarding transport to [facility]. Both agreeable to transport via [facility]. CSW contacted and arranged transportation through [place], family notified.   Abundio Miu, [place] Social Worker [facility] Cell#: [phone]

## 2016-10-24 NOTE — Plan of Care (Signed)
Problem: Pain Management: Goal: General experience of comfort will improve Outcome: Progressing Pt currently denies pain on Morphine gtt.

## 2016-10-24 NOTE — Progress Notes (Signed)
Report called to Helene Kelp at [facility].  All questions answered.  Contact number provided.  Pt wife and dtrs spoke w/ CSW and agreeable to transfer.

## 2016-10-24 NOTE — Progress Notes (Signed)
Nutrition Brief Note  Pt identified as at nutrition risk on the Malnutrition Screen Tool.  Chart reviewed. Pt's GOC: comfort care.  No nutrition interventions warranted at this time.   Clayton Bibles, MS, RD, LDN Pager: [phone] After Hours Pager: [phone]

## 2016-10-24 NOTE — Plan of Care (Signed)
Problem: Education: Goal: Knowledge of the prescribed therapeutic regimen will improve Outcome: Progressing Dr Domingo Cocking to meet with pt's wife this afternoon.

## 2016-10-24 NOTE — Progress Notes (Signed)
[facility] Met with patient's spouse and Dr. Domingo Cocking to discuss options. Spouse agreeable to transfer to [facility] today. Dr. Orpah Melter will assume care. CSW [place] aware.   Please fax discharge summary to [phone].  RN please call report to [phone].  Please arrange transport through [facility].  Thank you,  Erling Conte, LCSW [phone]

## 2016-10-31 DEATH — deceased

## 2017-04-09 ENCOUNTER — Other Ambulatory Visit: Payer: Self-pay | Admitting: Nurse Practitioner

## 2017-10-05 IMAGING — CT CT HEAD W/O CM
4 series · 16 of 47 positions shown, 18 images · non-contrast
Comparison: None.

CLINICAL DATA: Lethargy.  Confusion.  Altered mental status.

EXAM:
CT HEAD WITHOUT CONTRAST
TECHNIQUE: Contiguous axial images were obtained from the base of the skull
through the vertex without intravenous contrast.

[Series 2: head w/o · axial · non-contrast · 0.45mm/px · z∈[-136,-21]mm · 7 of 31 slices shown, 9 images]
[im 4/31  brain]
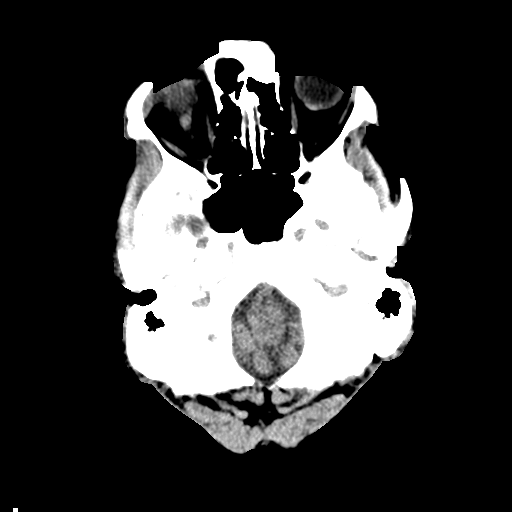
[im 4/31  bone]
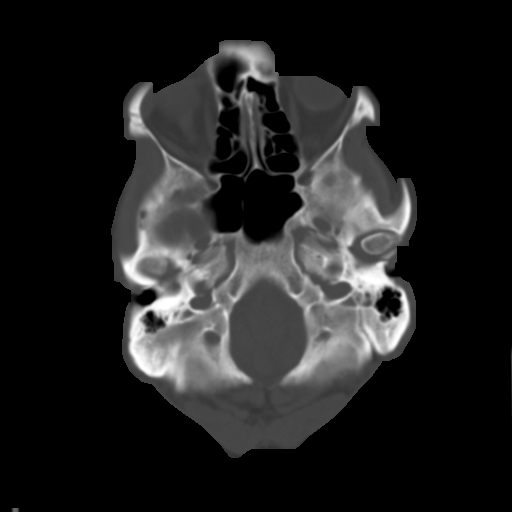
[im 8/31  brain]
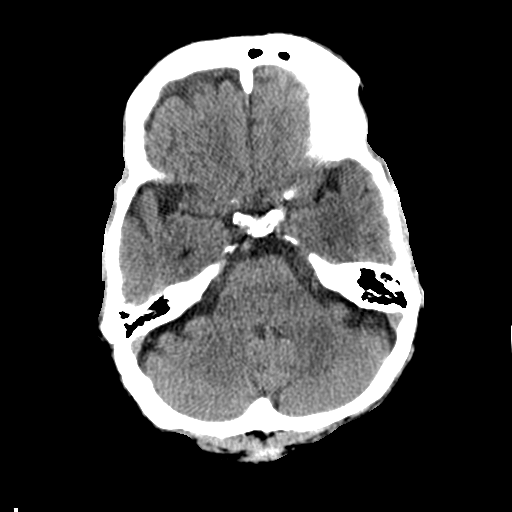
[im 12/31  brain]
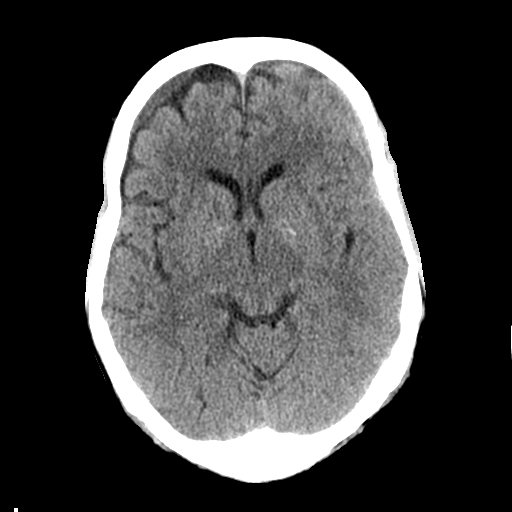
[im 16/31  brain]
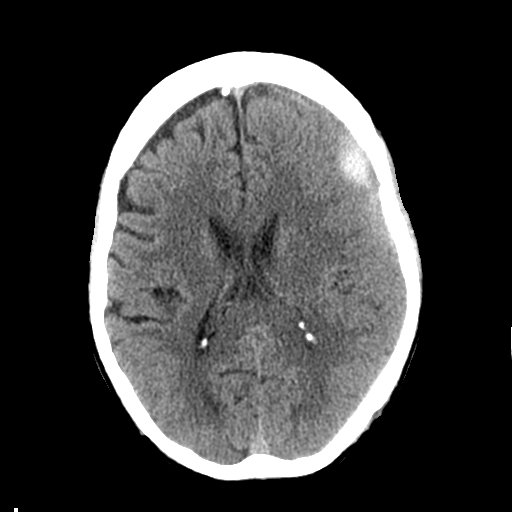
[im 19/31  brain]
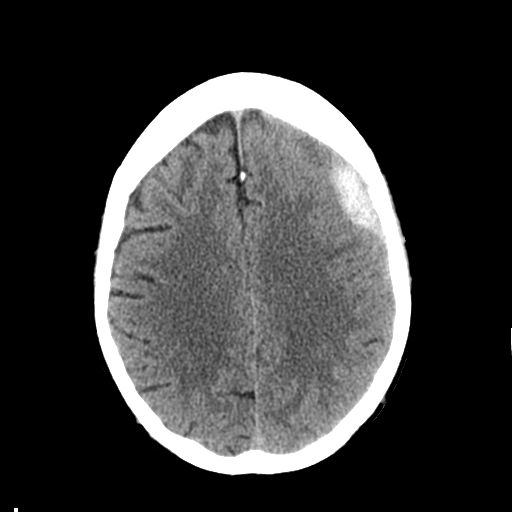
[im 19/31  bone]
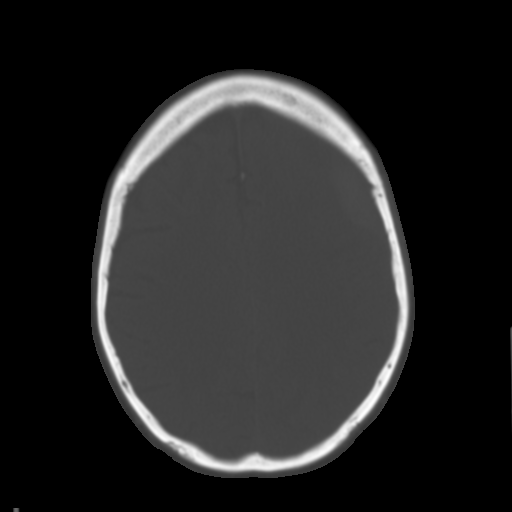
[im 23/31  brain]
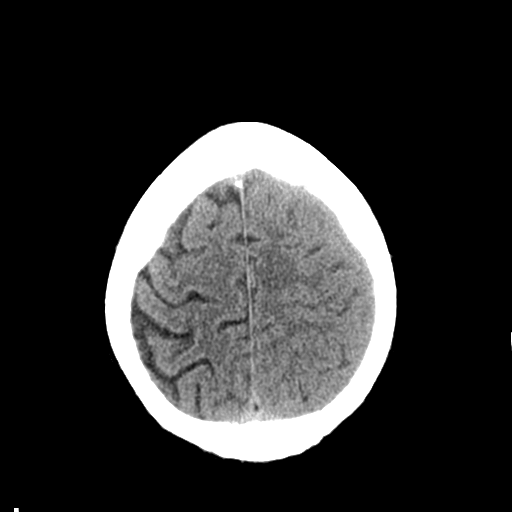
[im 27/31  brain]
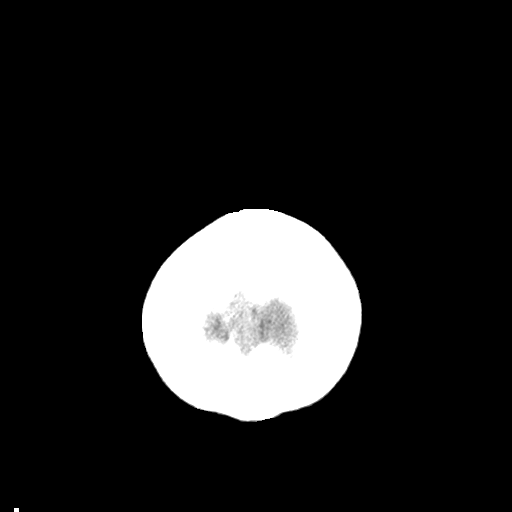

[Series 3: bone windows · axial · 0.45mm/px · z∈[-137,-107]mm · 3 of 76 slices shown]
[im 8/76  bone]
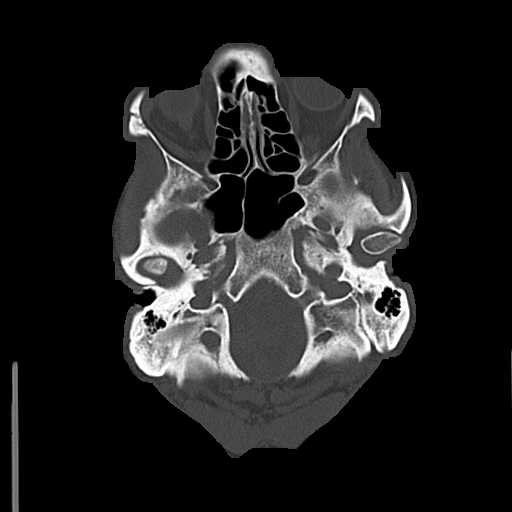
[im 16/76  bone]
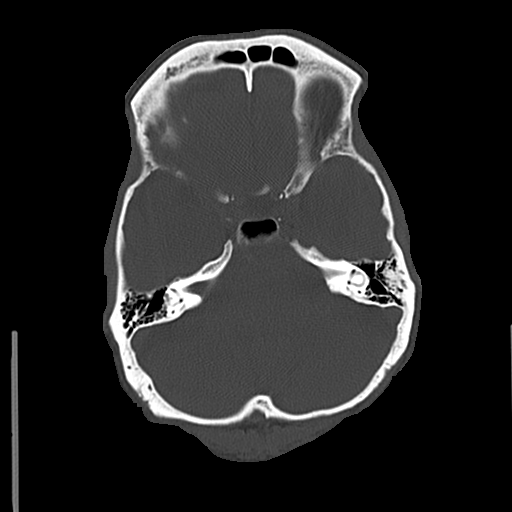
[im 23/76  bone]
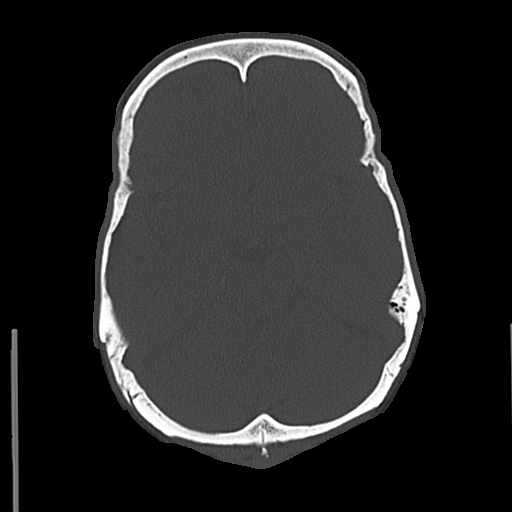

[Series 5: coronal · coronal · 0.32mm/px · 3 of 73 slices shown]
[im 25/73  brain]
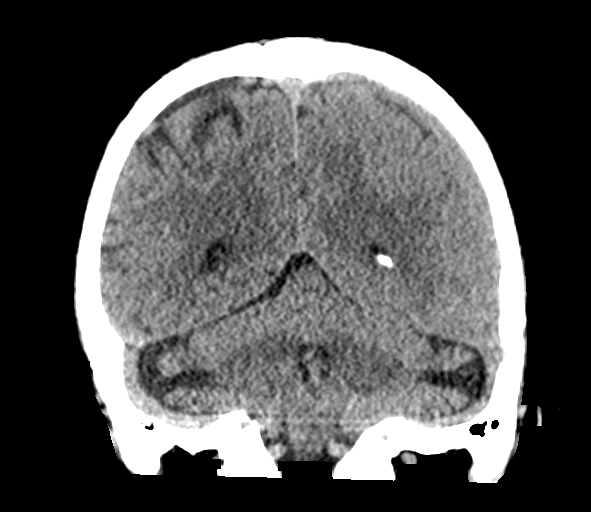
[im 33/73  brain]
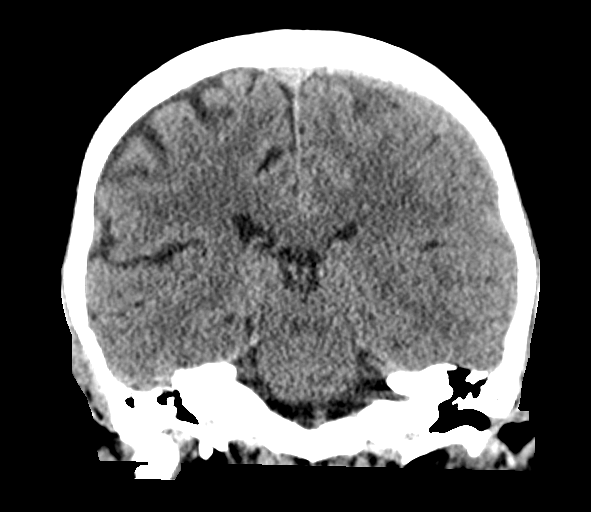
[im 41/73  brain]
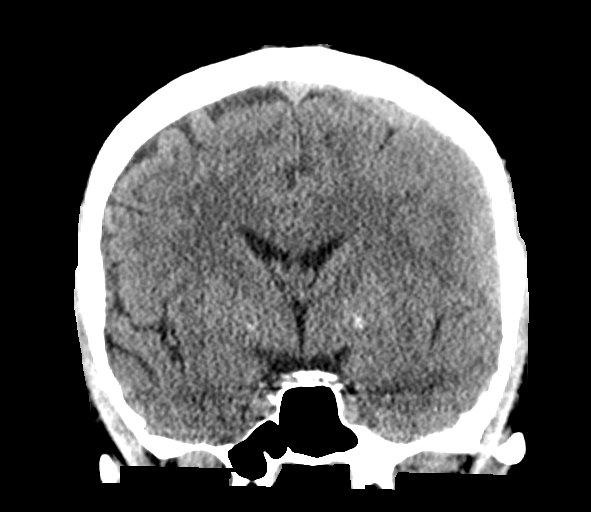

[Series 6: sagittal · sagittal · 0.32mm/px · 3 of 54 slices shown]
[im 18/54  brain]
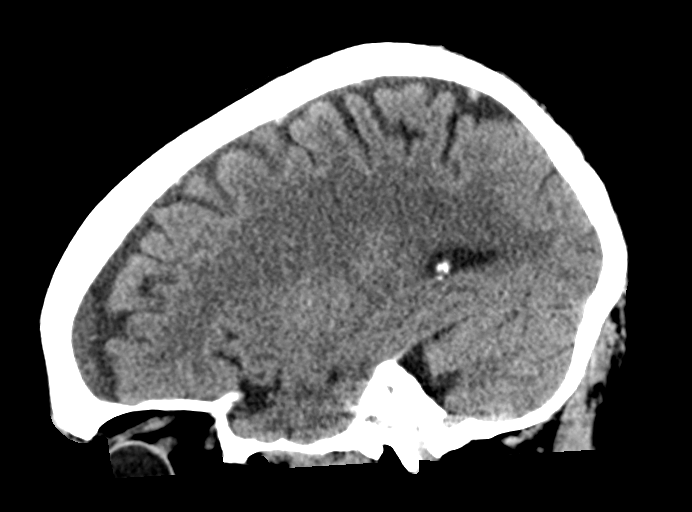
[im 27/54  brain]
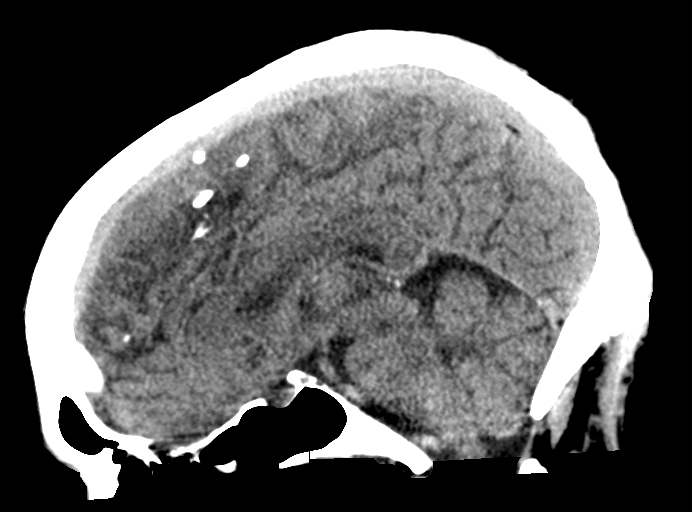
[im 36/54  brain]
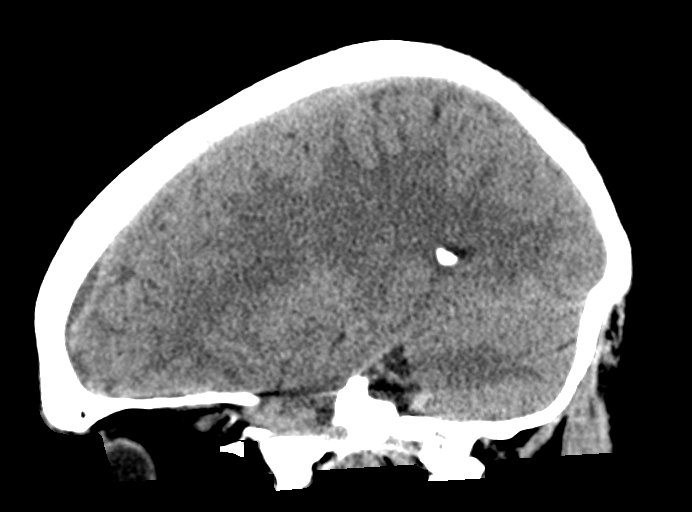

[16 of 47 positions shown; findings below may reference images not displayed]

FINDINGS: Brain: There is an acute on subacute subdural hematoma overlying the
left cerebral hemisphere. This measures 1.5 cm in maximum thickness
and has mass effect upon the left frontal lobe. There is effacement
of the underlying frontal lobe sulci. No midline shift noted. The
ventricular volumes appear normal. A chronic appearing subdural
hematoma overlying the right frontal lobe measures 7 mm in
thickness, image 11 of series 2. No evidence for acute brain
infarct.

Vascular: No hyperdense vessel or unexpected calcification.

Skull: Normal. Negative for fracture or focal lesion.

Sinuses/Orbits: The paranasal sinuses and mastoid air cells are
clear. The calvarium is intact.

Other: None.
IMPRESSION: 1. Acute on subacute left subdural hematoma is identified which
exerts mass effect upon the underlying frontal lobe.
2. Chronic right frontal lobe subdural hematoma.
Critical Value/emergent results were called by telephone at the time
of interpretation on 10/17/2016 at [DATE] to Dr. JOBKE HALBERSTADT ,
who verbally acknowledged these results.
